# Patient Record
Sex: Female | Born: 1940 | Race: White | Hispanic: No | Marital: Married | Smoking: Never smoker
Health system: Southern US, Community
[De-identification: ages and names within clinical notes are randomized; demographics above are authoritative.]

## PROBLEM LIST (undated history)

## (undated) DIAGNOSIS — IMO0002 Reserved for concepts with insufficient information to code with codable children: Secondary | ICD-10-CM

## (undated) DIAGNOSIS — Z889 Allergy status to unspecified drugs, medicaments and biological substances status: Secondary | ICD-10-CM

## (undated) DIAGNOSIS — Z974 Presence of external hearing-aid: Secondary | ICD-10-CM

## (undated) DIAGNOSIS — M720 Palmar fascial fibromatosis [Dupuytren]: Secondary | ICD-10-CM

## (undated) DIAGNOSIS — Z8719 Personal history of other diseases of the digestive system: Secondary | ICD-10-CM

## (undated) DIAGNOSIS — E039 Hypothyroidism, unspecified: Secondary | ICD-10-CM

## (undated) DIAGNOSIS — Z973 Presence of spectacles and contact lenses: Secondary | ICD-10-CM

## (undated) DIAGNOSIS — M1990 Unspecified osteoarthritis, unspecified site: Secondary | ICD-10-CM

## (undated) DIAGNOSIS — I10 Essential (primary) hypertension: Secondary | ICD-10-CM

## (undated) DIAGNOSIS — M329 Systemic lupus erythematosus, unspecified: Secondary | ICD-10-CM

## (undated) DIAGNOSIS — K219 Gastro-esophageal reflux disease without esophagitis: Secondary | ICD-10-CM

## (undated) DIAGNOSIS — F419 Anxiety disorder, unspecified: Secondary | ICD-10-CM

## (undated) HISTORY — PX: DILATION AND CURETTAGE OF UTERUS: SHX78

## (undated) HISTORY — PX: TYMPANOSTOMY TUBE PLACEMENT: SHX32

## (undated) HISTORY — PX: MASTOIDECTOMY: SHX711

## (undated) HISTORY — PX: COLONOSCOPY: SHX174

## (undated) HISTORY — PX: APPENDECTOMY: SHX54

## (undated) HISTORY — PX: TONSILLECTOMY: SUR1361

## (undated) HISTORY — PX: TUBAL LIGATION: SHX77

---

## 1972-07-26 HISTORY — PX: TUBAL LIGATION: SHX77

## 1989-07-26 DIAGNOSIS — C801 Malignant (primary) neoplasm, unspecified: Secondary | ICD-10-CM

## 1989-07-26 HISTORY — DX: Malignant (primary) neoplasm, unspecified: C80.1

## 1989-07-26 HISTORY — PX: BREAST LUMPECTOMY: SHX2

## 1998-01-14 ENCOUNTER — Other Ambulatory Visit: Admission: RE | Admit: 1998-01-14 | Discharge: 1998-01-14 | Payer: Self-pay | Admitting: Otolaryngology

## 1998-02-27 ENCOUNTER — Other Ambulatory Visit: Admission: RE | Admit: 1998-02-27 | Discharge: 1998-02-27 | Payer: Self-pay | Admitting: Obstetrics & Gynecology

## 1999-05-13 ENCOUNTER — Encounter: Payer: Self-pay | Admitting: Emergency Medicine

## 1999-05-13 ENCOUNTER — Emergency Department (HOSPITAL_COMMUNITY): Admission: EM | Admit: 1999-05-13 | Discharge: 1999-05-13 | Payer: Self-pay | Admitting: Emergency Medicine

## 1999-08-26 ENCOUNTER — Ambulatory Visit (HOSPITAL_COMMUNITY): Admission: RE | Admit: 1999-08-26 | Discharge: 1999-08-26 | Payer: Self-pay | Admitting: Gastroenterology

## 1999-11-12 ENCOUNTER — Encounter: Admission: RE | Admit: 1999-11-12 | Discharge: 1999-11-12 | Payer: Self-pay | Admitting: Obstetrics & Gynecology

## 1999-11-12 ENCOUNTER — Encounter: Payer: Self-pay | Admitting: Obstetrics & Gynecology

## 2000-10-14 ENCOUNTER — Encounter: Payer: Self-pay | Admitting: Obstetrics & Gynecology

## 2000-10-14 ENCOUNTER — Encounter: Admission: RE | Admit: 2000-10-14 | Discharge: 2000-10-14 | Payer: Self-pay | Admitting: Obstetrics & Gynecology

## 2001-02-08 ENCOUNTER — Encounter (INDEPENDENT_AMBULATORY_CARE_PROVIDER_SITE_OTHER): Payer: Self-pay | Admitting: *Deleted

## 2001-02-08 ENCOUNTER — Other Ambulatory Visit: Admission: RE | Admit: 2001-02-08 | Discharge: 2001-02-08 | Payer: Self-pay | Admitting: Otolaryngology

## 2001-08-11 ENCOUNTER — Other Ambulatory Visit: Admission: RE | Admit: 2001-08-11 | Discharge: 2001-08-11 | Payer: Self-pay | Admitting: Obstetrics & Gynecology

## 2001-10-16 ENCOUNTER — Encounter: Admission: RE | Admit: 2001-10-16 | Discharge: 2001-10-16 | Payer: Self-pay | Admitting: Obstetrics & Gynecology

## 2001-10-16 ENCOUNTER — Encounter: Payer: Self-pay | Admitting: Obstetrics & Gynecology

## 2002-08-16 ENCOUNTER — Other Ambulatory Visit: Admission: RE | Admit: 2002-08-16 | Discharge: 2002-08-16 | Payer: Self-pay | Admitting: Obstetrics & Gynecology

## 2003-10-23 ENCOUNTER — Other Ambulatory Visit: Admission: RE | Admit: 2003-10-23 | Discharge: 2003-10-23 | Payer: Self-pay | Admitting: Obstetrics & Gynecology

## 2004-05-07 ENCOUNTER — Encounter: Admission: RE | Admit: 2004-05-07 | Discharge: 2004-05-07 | Payer: Self-pay | Admitting: Internal Medicine

## 2004-11-26 ENCOUNTER — Other Ambulatory Visit: Admission: RE | Admit: 2004-11-26 | Discharge: 2004-11-26 | Payer: Self-pay | Admitting: Obstetrics & Gynecology

## 2009-07-26 HISTORY — PX: DILATION AND CURETTAGE OF UTERUS: SHX78

## 2009-11-26 ENCOUNTER — Emergency Department (HOSPITAL_COMMUNITY): Admission: EM | Admit: 2009-11-26 | Discharge: 2009-11-26 | Payer: Self-pay | Admitting: Emergency Medicine

## 2010-10-13 LAB — CBC
HCT: 33.4 % — ABNORMAL LOW (ref 36.0–46.0)
MCHC: 33.2 g/dL (ref 30.0–36.0)
MCV: 93.3 fL (ref 78.0–100.0)
Platelets: 191 10*3/uL (ref 150–400)
RDW: 14.4 % (ref 11.5–15.5)
WBC: 10.8 10*3/uL — ABNORMAL HIGH (ref 4.0–10.5)

## 2010-10-13 LAB — BASIC METABOLIC PANEL
BUN: 22 mg/dL (ref 6–23)
CO2: 26 mEq/L (ref 19–32)
Chloride: 109 mEq/L (ref 96–112)
Creatinine, Ser: 1 mg/dL (ref 0.4–1.2)
Glucose, Bld: 105 mg/dL — ABNORMAL HIGH (ref 70–99)

## 2010-10-13 LAB — POCT CARDIAC MARKERS: Myoglobin, poc: 78.4 ng/mL (ref 12–200)

## 2010-10-13 LAB — DIFFERENTIAL
Basophils Relative: 0 % (ref 0–1)
Eosinophils Absolute: 0.1 10*3/uL (ref 0.0–0.7)
Eosinophils Relative: 1 % (ref 0–5)
Neutrophils Relative %: 81 % — ABNORMAL HIGH (ref 43–77)

## 2010-10-13 LAB — GLUCOSE, CAPILLARY

## 2010-12-11 NOTE — Procedures (Signed)
[place]. [facility]  Patient:    Amy Krause                          MRN: [id number] Proc. Date: 08/26/99 Adm. Date:  [id number] Attending:  Rich Brave CC:         Freddy Finner, M.D.                           Procedure Report  PROCEDURE:  Colonoscopy.  SURGEON:  Florencia Reasons, M.D.  INDICATIONS:  A 70 year old female with negative colonoscopy about 4 years ago, who recently had quite a bit of rectal bleeding, which has pretty much tapered off since that time.  FINDINGS:  Right side diverticulosis.  Minimal internal hemorrhoids.  PROCEDURE:  The nature, purpose, and risks of the procedure were familiar to the patient from prior examination.  She provided written consent.  Sedation was fentanyl 85 mcg and Versed 10 mg IV without arrhythmias or desaturation.  The Olympus adult video colonoscope was advanced quite easily to the region of he hepatic flexure, but then we encountered considerable looping, which was hard to overcome, despite having the patient in different positions.  Eventually, we got a short distance above the cecum and to turn the patient into the right lateral and ultimately the left lateral decubitus position to reach the base of the cecum.  Pullback was then performed.  The quality of the prep was excellent and it is felt that all areas were well seen.  There was a moderate amount of right sided diverticulosis, but no evidence of polyps, cancer, colitis, or vascular malformations.  Retroflexion in the rectum, as well as, pullout through the anal canal were really unremarkable, without overt  enlargement of internal hemorrhoids.  No biopsies were obtained.  The patient tolerated the procedure well and there were no apparent complications.  IMPRESSION:  Essentially normal colonoscopy.  Mild to moderate right side diverticulosis.  Small internal hemorrhoids due not look impressive at this time, but  presumably account for her previous bleeding.  PLAN:  Consider screening flexible sigmoidoscopy in 5 years. DD:  08/26/99 TD:  08/26/99 Job: [id number]

## 2012-03-14 ENCOUNTER — Other Ambulatory Visit: Payer: Self-pay | Admitting: Obstetrics & Gynecology

## 2012-03-14 DIAGNOSIS — Z1231 Encounter for screening mammogram for malignant neoplasm of breast: Secondary | ICD-10-CM

## 2012-03-21 ENCOUNTER — Other Ambulatory Visit: Payer: Self-pay | Admitting: Obstetrics & Gynecology

## 2012-03-21 DIAGNOSIS — M8580 Other specified disorders of bone density and structure, unspecified site: Secondary | ICD-10-CM

## 2012-04-03 ENCOUNTER — Ambulatory Visit
Admission: RE | Admit: 2012-04-03 | Discharge: 2012-04-03 | Disposition: A | Discharge status: Home / Self Care | Payer: Medicare Other | Source: Ambulatory Visit | Attending: Obstetrics & Gynecology | Admitting: Obstetrics & Gynecology

## 2012-04-03 DIAGNOSIS — M8580 Other specified disorders of bone density and structure, unspecified site: Secondary | ICD-10-CM

## 2012-04-03 DIAGNOSIS — Z1231 Encounter for screening mammogram for malignant neoplasm of breast: Secondary | ICD-10-CM

## 2012-04-05 ENCOUNTER — Ambulatory Visit: Payer: Medicare Other

## 2012-04-20 ENCOUNTER — Other Ambulatory Visit: Payer: Self-pay | Admitting: Obstetrics & Gynecology

## 2012-04-20 DIAGNOSIS — R928 Other abnormal and inconclusive findings on diagnostic imaging of breast: Secondary | ICD-10-CM

## 2012-04-21 ENCOUNTER — Ambulatory Visit
Admission: RE | Admit: 2012-04-21 | Discharge: 2012-04-21 | Disposition: A | Discharge status: Home / Self Care | Payer: Medicare Other | Source: Ambulatory Visit | Attending: Obstetrics & Gynecology | Admitting: Obstetrics & Gynecology

## 2012-04-21 DIAGNOSIS — R928 Other abnormal and inconclusive findings on diagnostic imaging of breast: Secondary | ICD-10-CM

## 2013-02-16 ENCOUNTER — Other Ambulatory Visit: Payer: Self-pay | Admitting: Otolaryngology

## 2013-02-16 ENCOUNTER — Other Ambulatory Visit: Payer: Self-pay

## 2013-02-16 DIAGNOSIS — Z1231 Encounter for screening mammogram for malignant neoplasm of breast: Secondary | ICD-10-CM

## 2013-02-16 DIAGNOSIS — H903 Sensorineural hearing loss, bilateral: Secondary | ICD-10-CM

## 2013-02-20 ENCOUNTER — Ambulatory Visit
Admission: RE | Admit: 2013-02-20 | Discharge: 2013-02-20 | Disposition: A | Discharge status: Home / Self Care | Payer: Medicare Other | Source: Ambulatory Visit | Attending: Otolaryngology | Admitting: Otolaryngology

## 2013-02-20 DIAGNOSIS — H903 Sensorineural hearing loss, bilateral: Secondary | ICD-10-CM

## 2013-04-04 ENCOUNTER — Ambulatory Visit
Admission: RE | Admit: 2013-04-04 | Discharge: 2013-04-04 | Disposition: A | Discharge status: Home / Self Care | Payer: Medicare Other | Source: Ambulatory Visit

## 2013-04-04 DIAGNOSIS — Z1231 Encounter for screening mammogram for malignant neoplasm of breast: Secondary | ICD-10-CM

## 2013-05-01 ENCOUNTER — Ambulatory Visit
Admission: RE | Admit: 2013-05-01 | Discharge: 2013-05-01 | Disposition: A | Discharge status: Home / Self Care | Payer: Medicare Other | Source: Ambulatory Visit | Attending: Internal Medicine | Admitting: Internal Medicine

## 2013-05-01 ENCOUNTER — Other Ambulatory Visit: Payer: Self-pay | Admitting: Internal Medicine

## 2013-05-01 DIAGNOSIS — R05 Cough: Secondary | ICD-10-CM

## 2013-05-02 ENCOUNTER — Encounter (HOSPITAL_BASED_OUTPATIENT_CLINIC_OR_DEPARTMENT_OTHER): Payer: Self-pay | Admitting: *Deleted

## 2013-05-02 NOTE — Progress Notes (Signed)
Pt to stay overnight-bring all meds and overnight bag

## 2013-05-02 NOTE — Progress Notes (Signed)
Did not need labs, but dr Valentina Lucks did ekg,cxr,labs yesterday 05/01/13-called for results

## 2013-05-04 NOTE — H&P (Signed)
Amy Krause is an 72 y.o. female.   Chief Complaint: Chronic drainage, right ear HPI: See H&P below  History & Physical Examination   Patient: Amy Krause  Provider: Ermalinda Barrios, MD, MS, FACS  Date of Service:  May 02, 2013  Location: [facility] [place], [place].                  [address]                  [place], [place]   [id number]                                Ph: [phone], Fax: [phone]                  [email or web address]     Provider: Ermalinda Barrios, MD, MS, FACS Encounter Date: May 02, 2013  Patient: Amy Krause, Amy Krause) Sex: Female       DOB: Amy Krause, Amy Krause      Age: 62 year Krause month       Race: White Address: [address],  [place]  [place]  [address] Primary [address].: Jonny Ruiz GRIFFIN Insurance: MEDICARE ADVANTAGE  Referred By:  Ermalinda Barrios   Visit Type: Amy Krause, a 54 year 44 month White female is here today for a pre-operative visit.  Complaint/HPI: The patient was here today for a preoperative evaluation prior to undergoing a right canal up to the mastoidectomy for treatment of chronic suppurative otitis media and mastoiditis. The patient has seen Dr. Valentina Lucks. He started her on oral antibiotics and a burst of steroids. She denies fever, chills, or night sweats. She has been having some purulent, productive cough in the morning.  Previous history: The patient was seen as an emergency workin due to complaints of draining again from her right ear. She restarted Tobradex drops this morning. She denies fever, otalgia, vertigo or tinnitus. She related that she was tired of the intermittent drainage and multiple drops and oral antibiotics. She was awake and alert today.  Previous Hx: The patient was here today for follow-up after almost finishing six weeks of doxycycline and Tobradex drops. Patient denies any otorrhea and has not had recent otalgia. She feels that her right ear is full. She has no other symptoms. Patient has  found it very difficult to time the taking of her different medications.  Previous history: The patient is here today after taking antibiotics for three weeks, and using topical Tobradex drops AU for three weeks. 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 She is feeling somewhat better and is unable to tell if she is draining from her right ear as she is using drops BID. She has had to obtain a vaginal suppository for a yeast infection.  Previous history: Patient was here today in follow-up after continuing to use Tobradex ophthalmic drops in the left ear. The patient continues to complain of drainage from both ears, and a bass sound in her hearing. Her right ear is bothering her more than her left ear. She is on allergy desensitization immunizations by Dr. Concord Callas. She does not have any allergy symptoms. There have been no environmental changes at her house. She has failed several rounds of steroids and antibiotics as well as topical otic drops. Her recent cultures have been negative. She originally grew coag neg  staph. The patient is frustrated by not being able to hear well.  Previous history: Patient reports draining from her ears again after using Tobradex drops. Has had some tinnitus. She has had more drainage from the right than the left. She complains of being frustrated with the continued drainage and not being able to tell how many drops she is placing in her ear. She has lupus skin lesions.  Previous Hx: The patient's her today after being treated with multiple medications. She is feeling much better and denies otorrhea or otalgia. She looks much better.  Previous history: The patient was here today complaining of feeling very badly and draining from both ears. She can understand why she is draining after she has had tubes placed. She denied any upper respiratory tract infection, cough, fever, tinnitus or vertigo. She has not been swimming.  Previous  history: The patient was here today for follow-up after undergoing BMT's in the operating room. She thinks that her hearing has decreased in the left ear and complains of some clear drainage when she sleeps on her left ear. She has no other symptoms.  Previous history: The patient is here complaining of decreased hearing in the left ear. Her Paparella type I tubes have been in place for many years. She denies otorrhea, tinnitus or vertigo. She has been experiencing some minor otalgia. In the left ear.   Previous history: The patient was here today for a routine ear and BMT check. She denies otorrhea, change in hearing, or otalgia. She is successfully wearing hearing aids. She does not report any change in hearing.  Previous Hx: The patient is here today to have her ears evaluated. She has had a Paparella type II tube in her right tympanic membrane for 23 years. She is had a Paparella type one tube in her left tympanic membrane since 2005. She is doing well. She has no complaints of otorrhea or otalgia.  Previous history: Patient is here today to have her tubes checked. She does not have any complaints. She denies otorrhea or otalgia. She is wearing a hearing aid in the right ear.  Previous history: The patient is here for a tube check AU. She complains only of some itching of her left ear. She denies any otorrhea, otalgia, tinnitus or vertigo.   Current Medication: 1. Doxycycline Mono 100 Mg Cap  SIG: Take 1 twice daily x 10 days 2. Doxycycline Mono 100 Mg Cap  SIG: take 1 tab PO BID 3. Clobetasol 0.05% Ointment (Other MD)  4. Evista 60 Mg Tablet (Other MD)  5. Levothyroxine 75 Mcg Tablet (Other MD)  6. Calcium 500 Mg Chewable Tablet 500 Mg Calcium (1,250 Mg) (Other MD)  7. Levoxyl 75 Mcg Tablet (Other MD)  8. Multi Vitamin Daily Tablet (Other MD)   Medical History: Vaccinations: Flu vaccinations: Yes, had a flu shot since April 25, 2013 - code [id number].  Pneumonia vaccination: Yes - code  4040F.  Surgical History: Prior surgeries include lumpectomy and Mastoidectomy.  Cancer: (+) Cancer: Breast Cancer..  Family History: The patient has a family history of Diabetes mellitus.  Social History: Smoking: Her current smoking status is never smoker/non-smoker. Alcohol: Patient denies drinking alcoholic beverages. Marital Status: Patient is married. HIV status: (-) HIV status. Recreational Drug Use: She denies recreational drug use.  Allergy: Sulfa (Sulfonamide Antibiotics), Sulfasalazine  ROS: General: (-) fever, (-) chills, (-) night sweats, (-) fatigue, (-) weakness, (-) changes in appetite or weight. (+) seasonal allergies. Head: (-) headaches, (-) head  injury or deformity. Eyes: (-) visual changes, (-) eye pain, (-) eye discharges, (-) redness, (-) itching, (-) excessive tearing, (-) double or blurred vision, (-) glaucoma, (-) cataracts. Ears: (+) earache , stopped up feeling. Nose and Sinuses: (-) frequent colds, (-) nasal stuffiness or itchiness, (-) postnasal drip, (-) hay fever, (-) nosebleeds, (-) sinus trouble. Mouth and Throat: (-) bleeding gums, (-) toothache, (-) odd taste sensations, (-) sores on tongue, (-) frequent sore throat, (-) hoarseness. Neck: (-) swollen glands, (-) enlarged thyroid, (-) neck pain. Cardiac: (-) chest pain, (-) edema, (-) high blood pressure, (-) irregular heartbeat, (-) orthopnea, (-) palpitations, (-) paroxysmal nocturnal dyspnea, (-) shortness of breath. Respiratory: (-) cough, (-) hemoptysis, (-) shortness of breath, (-) cyanosis, (-) wheezing, (-) nocturnal choking or gasping, (-) TB exposure. Breasts: cancer. Gastrointestinal: (-) abdominal pain, (-) heartburn, (-) constipation, (-) diarrhea, (-) nausea, (-) vomiting, (-) hematochezia, (-) melena, (-) change in bowel habits. Urinary: (-) dysuria, (-) frequency, (-) urgency, (-) hesitancy, (-) polyuria, (-) nocturia, (-) hematuria, (-) urinary incontinence, (-) flank pain, (-)  change in urinary habits. Gynecologic/Urologic: (-) genital sores or lesions, (-) history of STD, (-) sexual difficulties. Musculoskeletal: (+) arthritis. Peripheral Vascular: (-) intermittent claudication, (-) cramps, (-) varicose veins, (-) thrombophlebitis. Neurological: (-) numbness, (-) tingling, (-) tremors, (-) seizures, (-) vertigo, (-) dizziness, (-) memory loss, (-) any focal or diffuse neurological deficits. Psychiatric: (-) anxiety, (-) depression, (-) sleep disturbance, (-) irritability, (-) mood swings, (-) suicidal thoughts or ideations. Endocrine: (+) thyroid problems. Hematologic/Lymphatic: (-) anemia, (-) easy bruising, (-) excessive bleeding, (-) history of blood transfusions. Skin: (-) rashes, (-) lumps, (-) itching, (-) dryness, (-) acne, (-) discoloration, (-) recurrent skin infections, (-) changes in hair, nails or moles.  Vital Signs: Weight:   76.204 kgs Height:   4\' 11"  BMI:   33.93 BSA:   1.78 BP:   125/80  Examination: General Appearance - Adult: The patient is a well-developed, well-nourished, female, has no recognizable syndromes or patterns of malformation, and is in no acute distress. She is awake, alert, coherent, spontaneous, and logical. She is oriented to time, place, and person and communicates without difficulty.  Head: The patient's head was normocephalic and without any evidence of trauma or lesions.  Face: Her facial motion was intact and symmetric bilaterally with normal resting facial tone and voluntary facial power.  Skin: Gross inspection of her facial skin demonstrated no evidence of abnormality.  Eyes: Her pupils are equal, regular, reactive to light and accomodate (PERRLA). Extraocular movements were intact (EOMI). Conjunctivae were normal. There was no sclera icterus. There was no nystagmus. Eyelids appeared normal. There was no ptosis, lidlag, lid edema, or lagophthalmus.  External ears: Both of her external ears were normal in size,  shape, angulation, and location.  External auditory canals: Examination of the external auditory canals revealed Procedure: The microscope and suction were used to clean the left ear canal. There small amount of thick mucoid drainage around the right tube. The tube was patent. There was watery drainage around the left tube. The tube was patent after the drainage was suction evacuated.  Right Tympanic Membrane: The right tube is in position with some moisture around the circumference of the tube. The moisture was not purulent.  Left Tympanic Membrane: The left tube was in place. There were some slight moisture covering the anterior one half of the tympanic membrane. It was not purulent.  Nose - external exam: External examination of the nose revealed a stable nasal  dorsum with normal support, normal skin, and patent nares. There were no deformities. Nose - internal exam: Anterior rhinoscopy revealed healthy, pink nasal septal and inferior/middle turbinate mucosa. The nasal septum was midline and without lesions or perforations. There was no bleeding noted. There were no polyps, lesions, masses or foreign bodies. Her airway was patent bilaterally.  Oral Cavity: Examination of the oral cavity revealed healthy moist mucosa, no evidence of lesions, ulcerations, erythema, edema, or leukoplakia. Gingiva and teeth were unremarkable. Her lips, tongue and palates were normal. There were no lingual fasciculations. The oropharynx was symmetric and without lesions. The gag reflex was intact and symmetric.  Neck: Examination of her neck revealed full range of motion without pain. There were no significant palpable masses or cervical lymphadenopathy. There was normal laryngeal crepitus. The trachea was midline. Her thyroid gland was not enlarged and did not have any palpable masses. There was no evidence of jugular venous distention. There were no audible carotid bruits.  Audiology Procedures: Audiogram/Graph  Audiogram: I have referred the patient for audiometric testing. I have reviewed the patient's audiogram. (EMK). The patient was found to have moderate to moderately severe bilateral asymmetric sensorineural hearing losses, right ear greater than left ear with SRTs of 55 dB right ear, 45 dB left ear. She had 80% discrimination right ear and 100% discrimination left ear.  Impression: Other:  1. The patient has completed six weeks of oral doxycycline and Tobradex drops AU and began to drain again from her right ear. Recommended that we now proceed with a R canal up tympanomastoidectomy. She has been evaluated by Dr. Kirby Funk, and has been cleared for general anesthesia. Schedule for 4 hours, general anesthesia, [facility], 23 hr RCC stay. Risks, complication, and alternatives were explained to her questions were invited and answered. Informed consent was signed and witnessed. Preop teaching and counseling were provided. The patient is very familiar with mastoidectomy procedures.  2. 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 3. Chronic bilateral eustachian tube dysfunction 4. She Latora wear her hearing aids. 5. Lupus skin lesions. 6. Recent productive cough, on antibiotics and oral steroids.  Plan: Clinical summary letter made available to patient today. This letter Kismet not be complete at time of service. Please contact our office within 3 days for a completed summary of today's visit.  Status: Infected ear(s): both ears. Medications: continue the current drug regimen as prescribed, Finish oral antibiotic, Tobradex ophthal drops 3 gtts both ears  TID x 1 week. and Discontinue Tobradex and begin vancomycin drops 3 drops TID x 1 wk. Diet: no restriction. Procedure: Tympanomastoidectomy: Right ear. Duration:  4 hours. Surgeon: Carolan Shiver MD Office Phone: [phone] Office Fax: [phone] Cell Phone: [phone]. Anesthesia Required:  General. Type of Tube: T-tube. [facility]: yes. Latex Allergy: no.  Informed consent: Informed consent was provided in a quiet examination room and was witnessed. Risks, complications, and alternatives of ear surgery (such as doing nothing or using a hearing aid) were explained to the and included, but were not limited to: infection, bleeding, reaction to anesthesia, delayed perforation of the tympanic membrane and need for future repair, facial paresis or paralysis, tinnitus, vertigo and/or disequilibrium, partial or total loss of hearing, transient or prolonged taste disturbance, need for additional procedures, failure and/extrusion of prostheses, complications related to the use of cements, hypertrophic scar formation, ear numbness, failure to preserve or improve hearing, other unforeseen or unpredictable complications, death, etc. Questions were invited and answered. Preoperative  teaching and counseling were provided. Informed consent - status: Informed consent was provided and was signed and witnessed. Patient Instructions: Water precautions were explained to the patient/parent/legal guardian. The patient is to protect their ears using cotton with vaseline plugs, wax/silicon ear plugs, fitted ear plugs, or custom ear plugs. Follow-Up: Postoperative visit as scheduled.  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 Wears glasses   . Wears hearing aid     right ear  . Multiple allergies   . Lupus     Past Surgical History  Procedure Laterality Date  . Tonsillectomy    . Appendectomy    . Tubal ligation    .  Dilation and curettage of uterus    . Colonoscopy    . Mastoidectomy  [phone]    right    History reviewed. No pertinent family history. Social History:  reports that she has never smoked. She does not have any smokeless tobacco history on file. She reports that she does not drink alcohol. Her drug history is not on file.  Allergies:  Allergies  Allergen Reactions  . Sulfa Antibiotics     Mouth blisters  . Tape     Blisters skin    No prescriptions prior to admission    No results found for this or any previous visit (from the past 48 hour(s)). No results found.  Review of Systems  Constitutional: Negative.   HENT: Positive for ear discharge, ear pain, hearing loss and tinnitus.   Eyes: Negative.   Respiratory: Negative.   Cardiovascular: Negative.   Gastrointestinal: Negative.   Genitourinary: Negative.   Musculoskeletal: Negative.   Skin: Negative.   Neurological: Negative.   Endo/Heme/Allergies: Negative.   Psychiatric/Behavioral: Negative.     Height 4\' 11"  (1.499 m), weight 74.39 kg (164 lb). Physical Exam   Assessment/Plan 1. Chronic suppurative otitis media and mastoiditis, right ear, documented on CT scan of her temporal bones 2. Bilateral asymmetric high-frequency sensorineural hearing loss 3. Chronic bilateral eustachian tube dysfunction 4. Lupus skin lesions 5. Recent upper respiratory tract infection 6. Proceed with right canal up tympanomastoidectomy, four hours, general anesthesia, 23 hour observation. Risks, complications, and alternatives have been explained to the patient. Questions were invited and answered. Informed consent has been signed and witnessed. Preop teaching and counseling have been completed. 7. The procedure is scheduled for May 08, 2013.   Chia Rock M 05/04/2013, 12:12 PM

## 2013-05-08 ENCOUNTER — Encounter (HOSPITAL_BASED_OUTPATIENT_CLINIC_OR_DEPARTMENT_OTHER): Payer: Self-pay | Admitting: Anesthesiology

## 2013-05-08 ENCOUNTER — Encounter (HOSPITAL_BASED_OUTPATIENT_CLINIC_OR_DEPARTMENT_OTHER): Payer: Medicare Other | Admitting: Anesthesiology

## 2013-05-08 ENCOUNTER — Encounter (HOSPITAL_BASED_OUTPATIENT_CLINIC_OR_DEPARTMENT_OTHER)
Admission: RE | Disposition: A | Discharge status: Home / Self Care | Payer: Self-pay | Source: Ambulatory Visit | Attending: Otolaryngology

## 2013-05-08 ENCOUNTER — Ambulatory Visit (HOSPITAL_BASED_OUTPATIENT_CLINIC_OR_DEPARTMENT_OTHER): Payer: Medicare Other | Admitting: Anesthesiology

## 2013-05-08 ENCOUNTER — Ambulatory Visit (HOSPITAL_BASED_OUTPATIENT_CLINIC_OR_DEPARTMENT_OTHER)
Admission: RE | Admit: 2013-05-08 | Discharge: 2013-05-09 | Disposition: A | Discharge status: Home / Self Care | Payer: Medicare Other | Source: Ambulatory Visit | Attending: Otolaryngology | Admitting: Otolaryngology

## 2013-05-08 DIAGNOSIS — H663X9 Other chronic suppurative otitis media, unspecified ear: Secondary | ICD-10-CM | POA: Insufficient documentation

## 2013-05-08 DIAGNOSIS — H7010 Chronic mastoiditis, unspecified ear: Secondary | ICD-10-CM | POA: Insufficient documentation

## 2013-05-08 HISTORY — DX: Presence of spectacles and contact lenses: Z97.3

## 2013-05-08 HISTORY — PX: TYMPANOMASTOIDECTOMY: SHX34

## 2013-05-08 HISTORY — DX: Allergy status to unspecified drugs, medicaments and biological substances: Z88.9

## 2013-05-08 HISTORY — DX: Presence of external hearing-aid: Z97.4

## 2013-05-08 HISTORY — DX: Systemic lupus erythematosus, unspecified: M32.9

## 2013-05-08 HISTORY — DX: Reserved for concepts with insufficient information to code with codable children: IMO0002

## 2013-05-08 LAB — POCT HEMOGLOBIN-HEMACUE: Hemoglobin: 12.8 g/dL (ref 12.0–15.0)

## 2013-05-08 SURGERY — TYMPANOPLASTY, WITH MASTOIDECTOMY
Anesthesia: General | Site: Ear | Laterality: Right | Wound class: Clean Contaminated

## 2013-05-08 MED ORDER — FENTANYL CITRATE 0.05 MG/ML IJ SOLN
INTRAMUSCULAR | Status: DC | PRN
  Administered 2013-05-08: 100 ug via INTRAVENOUS
  Administered 2013-05-08 (×2): 50 ug via INTRAVENOUS
  Administered 2013-05-08 (×2): 100 ug via INTRAVENOUS

## 2013-05-08 MED ORDER — ONDANSETRON HCL 4 MG/2ML IJ SOLN
4.0000 mg | Freq: Four times a day (QID) | INTRAMUSCULAR | Status: DC | PRN
  Administered 2013-05-08: 4 mg via INTRAVENOUS

## 2013-05-08 MED ORDER — EPINEPHRINE HCL 1 MG/ML IJ SOLN
INTRAMUSCULAR | Status: DC | PRN
  Administered 2013-05-08: 1 mg

## 2013-05-08 MED ORDER — ZOLPIDEM TARTRATE 5 MG PO TABS: 5.0000 mg | ORAL_TABLET | Freq: Every evening | ORAL | Status: DC | PRN

## 2013-05-08 MED ORDER — DEXAMETHASONE SODIUM PHOSPHATE 4 MG/ML IJ SOLN: 10.0000 mg | Freq: Once | INTRAMUSCULAR | Status: DC

## 2013-05-08 MED ORDER — FENTANYL CITRATE 0.05 MG/ML IJ SOLN: 50.0000 ug | INTRAMUSCULAR | Status: DC | PRN

## 2013-05-08 MED ORDER — PHENYLEPHRINE HCL 10 MG/ML IJ SOLN
INTRAMUSCULAR | Status: DC | PRN
  Administered 2013-05-08 (×2): 80 ug via INTRAVENOUS

## 2013-05-08 MED ORDER — CIPROFLOXACIN-HYDROCORTISONE 0.2-1 % OT SUSP
OTIC | Status: AC
  Filled 2013-05-08: qty 10

## 2013-05-08 MED ORDER — PROPOFOL 10 MG/ML IV BOLUS
INTRAVENOUS | Status: DC | PRN
  Administered 2013-05-08: 150 mg via INTRAVENOUS
  Administered 2013-05-08: 50 mg via INTRAVENOUS

## 2013-05-08 MED ORDER — METHYLENE BLUE 1 % INJ SOLN
INTRAMUSCULAR | Status: DC | PRN
  Administered 2013-05-08: 1 mL via INTRADERMAL

## 2013-05-08 MED ORDER — LIDOCAINE HCL 2 % IJ SOLN
INTRAMUSCULAR | Status: AC
  Filled 2013-05-08: qty 20

## 2013-05-08 MED ORDER — DEXTROSE IN LACTATED RINGERS 5 % IV SOLN
INTRAVENOUS | Status: DC
  Administered 2013-05-08: 23:00:00 via INTRAVENOUS
  Administered 2013-05-08: 125 mL/h via INTRAVENOUS

## 2013-05-08 MED ORDER — LIDOCAINE-EPINEPHRINE 1 %-1:100000 IJ SOLN
INTRAMUSCULAR | Status: DC | PRN
  Administered 2013-05-08: 4 mL via INTRADERMAL

## 2013-05-08 MED ORDER — ONDANSETRON HCL 4 MG/2ML IJ SOLN: 4.0000 mg | Freq: Once | INTRAMUSCULAR | Status: DC

## 2013-05-08 MED ORDER — CEFAZOLIN SODIUM-DEXTROSE 2-3 GM-% IV SOLR
INTRAVENOUS | Status: AC
  Filled 2013-05-08: qty 50

## 2013-05-08 MED ORDER — MEPERIDINE HCL 25 MG/ML IJ SOLN: 6.2500 mg | INTRAMUSCULAR | Status: DC | PRN

## 2013-05-08 MED ORDER — DEXAMETHASONE SODIUM PHOSPHATE 4 MG/ML IJ SOLN
4.0000 mg | Freq: Once | INTRAMUSCULAR | Status: AC
  Administered 2013-05-09: 4 mg via INTRAVENOUS

## 2013-05-08 MED ORDER — ONDANSETRON HCL 4 MG/2ML IJ SOLN
INTRAMUSCULAR | Status: DC | PRN
  Administered 2013-05-08: 4 mg via INTRAMUSCULAR

## 2013-05-08 MED ORDER — CEFAZOLIN SODIUM 1-5 GM-% IV SOLN
INTRAVENOUS | Status: AC
  Filled 2013-05-08: qty 50

## 2013-05-08 MED ORDER — ONDANSETRON HCL 4 MG/2ML IJ SOLN: 4.0000 mg | Freq: Once | INTRAMUSCULAR | Status: AC | PRN

## 2013-05-08 MED ORDER — MORPHINE BOLUS VIA INFUSION: 2.0000 mg | INTRAVENOUS | Status: DC | PRN

## 2013-05-08 MED ORDER — DEXAMETHASONE SODIUM PHOSPHATE 4 MG/ML IJ SOLN
6.0000 mg | Freq: Once | INTRAMUSCULAR | Status: AC
  Administered 2013-05-08: 6 mg via INTRAVENOUS

## 2013-05-08 MED ORDER — BACITRACIN ZINC 500 UNIT/GM EX OINT
TOPICAL_OINTMENT | CUTANEOUS | Status: DC | PRN
  Administered 2013-05-08 (×2): 1 via TOPICAL

## 2013-05-08 MED ORDER — ONDANSETRON HCL 4 MG/2ML IJ SOLN
INTRAMUSCULAR | Status: AC
  Filled 2013-05-08: qty 2

## 2013-05-08 MED ORDER — CEFAZOLIN SODIUM 1-5 GM-% IV SOLN
1.0000 g | Freq: Three times a day (TID) | INTRAVENOUS | Status: AC
  Administered 2013-05-08 – 2013-05-09 (×3): 1 g via INTRAVENOUS

## 2013-05-08 MED ORDER — HYDROMORPHONE HCL PF 1 MG/ML IJ SOLN
INTRAMUSCULAR | Status: AC
  Filled 2013-05-08: qty 1

## 2013-05-08 MED ORDER — DEXAMETHASONE SODIUM PHOSPHATE 10 MG/ML IJ SOLN
INTRAMUSCULAR | Status: AC
  Filled 2013-05-08: qty 1

## 2013-05-08 MED ORDER — METHYLPREDNISOLONE ACETATE 80 MG/ML IJ SUSP
INTRAMUSCULAR | Status: AC
  Filled 2013-05-08: qty 1

## 2013-05-08 MED ORDER — HYDROMORPHONE HCL PF 1 MG/ML IJ SOLN
0.2500 mg | INTRAMUSCULAR | Status: DC | PRN
  Administered 2013-05-08: 0.5 mg via INTRAVENOUS

## 2013-05-08 MED ORDER — DEXAMETHASONE SODIUM PHOSPHATE 4 MG/ML IJ SOLN
INTRAMUSCULAR | Status: DC | PRN
  Administered 2013-05-08: 10 mg via INTRAVENOUS

## 2013-05-08 MED ORDER — ACETAMINOPHEN 160 MG/5ML PO SOLN: 650.0000 mg | Freq: Four times a day (QID) | ORAL | Status: DC | PRN

## 2013-05-08 MED ORDER — CEFAZOLIN (ANCEF) 1 G IV SOLR
2.0000 g | INTRAVENOUS | Status: AC
  Administered 2013-05-08: 2 g

## 2013-05-08 MED ORDER — GLYCERIN (LAXATIVE) 2.1 G RE SUPP: 1.0000 | Freq: Every day | RECTAL | Status: DC | PRN

## 2013-05-08 MED ORDER — DIAZEPAM 5 MG PO TABS: 5.0000 mg | ORAL_TABLET | Freq: Three times a day (TID) | ORAL | Status: DC | PRN

## 2013-05-08 MED ORDER — CIPROFLOXACIN-DEXAMETHASONE 0.3-0.1 % OT SUSP
OTIC | Status: AC
  Filled 2013-05-08: qty 7.5

## 2013-05-08 MED ORDER — LIDOCAINE HCL (CARDIAC) 20 MG/ML IV SOLN
INTRAVENOUS | Status: DC | PRN
  Administered 2013-05-08: 100 mg via INTRAVENOUS

## 2013-05-08 MED ORDER — TOBRAMYCIN-DEXAMETHASONE 0.3-0.1 % OP SUSP
OPHTHALMIC | Status: AC
  Filled 2013-05-08: qty 2.5

## 2013-05-08 MED ORDER — LACTATED RINGERS IV SOLN
INTRAVENOUS | Status: DC
  Administered 2013-05-08 (×2): via INTRAVENOUS

## 2013-05-08 MED ORDER — MULTI-VITAMIN/MINERALS PO TABS: 1.0000 | ORAL_TABLET | Freq: Every day | ORAL | Status: DC

## 2013-05-08 MED ORDER — GUAIFENESIN ER 600 MG PO TB12: 1200.0000 mg | ORAL_TABLET | Freq: Two times a day (BID) | ORAL | Status: DC

## 2013-05-08 MED ORDER — RALOXIFENE HCL 60 MG PO TABS
60.0000 mg | ORAL_TABLET | Freq: Every evening | ORAL | Status: DC
Start: 2013-05-08 — End: 2013-05-09
  Administered 2013-05-08: 60 mg via ORAL

## 2013-05-08 MED ORDER — LIDOCAINE-EPINEPHRINE 1 %-1:100000 IJ SOLN
INTRAMUSCULAR | Status: AC
  Filled 2013-05-08: qty 1

## 2013-05-08 MED ORDER — SODIUM CHLORIDE 0.9 % IR SOLN
Status: DC | PRN
  Administered 2013-05-08: 500 mL

## 2013-05-08 MED ORDER — SODIUM CHLORIDE 0.9 % IR SOLN
Status: DC | PRN
  Administered 2013-05-08: 10:00:00

## 2013-05-08 MED ORDER — DEXAMETHASONE SODIUM PHOSPHATE 4 MG/ML IJ SOLN
8.0000 mg | Freq: Once | INTRAMUSCULAR | Status: AC
  Administered 2013-05-08: 8 mg via INTRAVENOUS

## 2013-05-08 MED ORDER — MIDAZOLAM HCL 5 MG/5ML IJ SOLN
INTRAMUSCULAR | Status: DC | PRN
  Administered 2013-05-08 (×2): 1 mg via INTRAVENOUS

## 2013-05-08 MED ORDER — METHYLENE BLUE 1 % INJ SOLN
INTRAMUSCULAR | Status: AC
  Filled 2013-05-08: qty 10

## 2013-05-08 MED ORDER — OXYCODONE HCL 5 MG PO TABS: 5.0000 mg | ORAL_TABLET | Freq: Once | ORAL | Status: AC | PRN

## 2013-05-08 MED ORDER — EPINEPHRINE HCL 1 MG/ML IJ SOLN
INTRAMUSCULAR | Status: AC
  Filled 2013-05-08: qty 1

## 2013-05-08 MED ORDER — MIDAZOLAM HCL 2 MG/2ML IJ SOLN: 1.0000 mg | INTRAMUSCULAR | Status: DC | PRN

## 2013-05-08 MED ORDER — LEVOTHYROXINE SODIUM 75 MCG PO TABS
75.0000 ug | ORAL_TABLET | Freq: Every day | ORAL | Status: DC
  Administered 2013-05-09: 75 ug via ORAL

## 2013-05-08 MED ORDER — EPHEDRINE SULFATE 50 MG/ML IJ SOLN
INTRAMUSCULAR | Status: DC | PRN
  Administered 2013-05-08 (×2): 10 mg via INTRAVENOUS

## 2013-05-08 MED ORDER — OXYCODONE HCL 5 MG/5ML PO SOLN: 5.0000 mg | Freq: Once | ORAL | Status: AC | PRN

## 2013-05-08 MED ORDER — GLYCOPYRROLATE 0.2 MG/ML IJ SOLN
INTRAMUSCULAR | Status: DC | PRN
  Administered 2013-05-08: 0.2 mg via INTRAVENOUS

## 2013-05-08 MED ORDER — SUCCINYLCHOLINE CHLORIDE 20 MG/ML IJ SOLN
INTRAMUSCULAR | Status: DC | PRN
  Administered 2013-05-08: 100 mg via INTRAVENOUS

## 2013-05-08 MED ORDER — OXYCODONE-ACETAMINOPHEN 5-325 MG PO TABS
1.0000 | ORAL_TABLET | ORAL | Status: DC | PRN
  Administered 2013-05-08 – 2013-05-09 (×5): 1 via ORAL

## 2013-05-08 MED ORDER — CALCIUM CARBONATE-VITAMIN D 500-200 MG-UNIT PO TABS: 1.0000 | ORAL_TABLET | Freq: Every day | ORAL | Status: DC

## 2013-05-08 MED ORDER — OXYCODONE-ACETAMINOPHEN 5-325 MG PO TABS
ORAL_TABLET | ORAL | Status: AC
  Filled 2013-05-08: qty 1

## 2013-05-08 MED ORDER — LIDOCAINE-EPINEPHRINE 0.5 %-1:200000 IJ SOLN
INTRAMUSCULAR | Status: AC
  Filled 2013-05-08: qty 1

## 2013-05-08 SURGICAL SUPPLY — 82 items
ATTRACTOMAT 16X20 MAGNETIC DRP (DRAPES) IMPLANT
BAG DECANTER FOR FLEXI CONT (MISCELLANEOUS) ×2 IMPLANT
BAG URINE DRAINAGE (UROLOGICAL SUPPLIES) IMPLANT
BANDAGE COBAN STERILE 2 (GAUZE/BANDAGES/DRESSINGS) IMPLANT
BIT DRILL LEGEND 0.5MM 70MM (BIT) IMPLANT
BIT DRILL LEGEND 1.0MM 70MM (BIT) IMPLANT
BIT DRILL LEGEND 4.0MM 70MM (BIT) IMPLANT
BLADE NEEDLE 3 SS STRL (BLADE) ×2 IMPLANT
BLADE SURG ROTATE 9660 (MISCELLANEOUS) ×2 IMPLANT
CANISTER SUCT 1200ML W/VALVE (MISCELLANEOUS) ×2 IMPLANT
CLOTH BEACON ORANGE TIMEOUT ST (SAFETY) ×2 IMPLANT
CORDS BIPOLAR (ELECTRODE) IMPLANT
COTTONBALL LRG STERILE PKG (GAUZE/BANDAGES/DRESSINGS) ×2 IMPLANT
DECANTER SPIKE VIAL GLASS SM (MISCELLANEOUS) ×2 IMPLANT
DEPRESSOR TONGUE BLADE STERILE (MISCELLANEOUS) ×4 IMPLANT
DRAIN PENROSE 1/2X12 LTX STRL (WOUND CARE) IMPLANT
DRAIN PENROSE 1/4X12 LTX STRL (WOUND CARE) IMPLANT
DRAPE INCISE 23X17 IOBAN STRL (DRAPES) ×1
DRAPE INCISE IOBAN 23X17 STRL (DRAPES) ×1 IMPLANT
DRAPE MICROSCOPE WILD 40.5X102 (DRAPES) ×2 IMPLANT
DRAPE SURG 17X23 STRL (DRAPES) ×2 IMPLANT
DRILL BIT LEGEND (BIT) ×2 IMPLANT
DRILL BIT LEGEND 7BA20-MN (BIT) IMPLANT
DRILL BIT LEGEND 7BA25-MN (BIT) IMPLANT
DRILL BIT LEGEND 7BA30-MN (BIT) IMPLANT
DRILL BIT LEGEND 7BA30D-MN (BIT) IMPLANT
DRILL BIT LEGEND 7BA30DL-MN (BIT) ×2 IMPLANT
DRILL BIT LEGEND 7BA30L-MN (BIT) ×2 IMPLANT
DRILL BIT LEGEND 7BA40-MN (BIT) IMPLANT
DRILL BIT LEGEND 7BA40D-MN (BIT) IMPLANT
DRILL BIT LEGEND 7BA50-MN (BIT) ×2 IMPLANT
DRILL BIT LEGEND 7BA50D-MN (BIT) ×2 IMPLANT
DRILL BIT LEGEND 7BA60-MN (BIT) IMPLANT
DRILL BIT LEGEND 7BA70-MN (BIT) ×2 IMPLANT
DRSG GLASSCOCK MASTOID ADT (GAUZE/BANDAGES/DRESSINGS) IMPLANT
DRSG GLASSCOCK MASTOID PED (GAUZE/BANDAGES/DRESSINGS) IMPLANT
ELECT COATED BLADE 2.86 ST (ELECTRODE) ×2 IMPLANT
ELECT PAIRED SUBDERMAL (MISCELLANEOUS) ×2
ELECT REM PT RETURN 9FT ADLT (ELECTROSURGICAL) ×2
ELECTRODE PAIRED SUBDERMAL (MISCELLANEOUS) ×1 IMPLANT
ELECTRODE REM PT RTRN 9FT ADLT (ELECTROSURGICAL) ×1 IMPLANT
GAUZE PACKING IODOFORM 1/4X5 (PACKING) ×2 IMPLANT
GAUZE SPONGE 4X4 12PLY STRL LF (GAUZE/BANDAGES/DRESSINGS) IMPLANT
GLOVE BIOGEL PI IND STRL 7.0 (GLOVE) ×2 IMPLANT
GLOVE BIOGEL PI INDICATOR 7.0 (GLOVE) ×2
GLOVE ECLIPSE 7.5 STRL STRAW (GLOVE) ×4 IMPLANT
GLOVE SURG SS PI 7.0 STRL IVOR (GLOVE) ×2 IMPLANT
GOWN PREVENTION PLUS XLARGE (GOWN DISPOSABLE) ×4 IMPLANT
IV NS 500ML (IV SOLUTION) ×1
IV NS 500ML BAXH (IV SOLUTION) ×1 IMPLANT
IV NS IRRIG 3000ML ARTHROMATIC (IV SOLUTION) ×2 IMPLANT
NDL SAFETY ECLIPSE 18X1.5 (NEEDLE) ×1 IMPLANT
NEEDLE 27GAX1X1/2 (NEEDLE) ×4 IMPLANT
NEEDLE BLD COLL 22X1.25 MULT (NEEDLE) ×2 IMPLANT
NEEDLE HYPO 18GX1.5 SHARP (NEEDLE) ×1
NS IRRIG 1000ML POUR BTL (IV SOLUTION) ×2 IMPLANT
PACK BASIN DAY SURGERY FS (CUSTOM PROCEDURE TRAY) ×2 IMPLANT
PACK ENT DAY SURGERY (CUSTOM PROCEDURE TRAY) ×2 IMPLANT
PENCIL BUTTON HOLSTER BLD 10FT (ELECTRODE) ×2 IMPLANT
PROBE NERVBE PRASS .33 (MISCELLANEOUS) IMPLANT
SET EXT MALE ROTATING LL 32IN (MISCELLANEOUS) ×2 IMPLANT
SET IRRIG Y TYPE TUR BLADDER L (SET/KITS/TRAYS/PACK) ×2 IMPLANT
SLEEVE SCD COMPRESS KNEE MED (MISCELLANEOUS) ×2 IMPLANT
SPONGE GAUZE 4X4 12PLY (GAUZE/BANDAGES/DRESSINGS) IMPLANT
SPONGE SURGIFOAM ABS GEL 12-7 (HEMOSTASIS) ×2 IMPLANT
STRIP CLOSURE SKIN 1/2X4 (GAUZE/BANDAGES/DRESSINGS) IMPLANT
SUT BONE WAX W31G (SUTURE) ×2 IMPLANT
SUT CHROMIC 3 0 PS 2 (SUTURE) ×4 IMPLANT
SUT CHROMIC 4 0 P 3 18 (SUTURE) IMPLANT
SUT ETHILON 5 0 PS 2 18 (SUTURE) IMPLANT
SUT MNCRL AB 3-0 PS2 18 (SUTURE) IMPLANT
SUT NOVAFIL 5 0 BLK 18 IN P13 (SUTURE) ×2 IMPLANT
SUT VIC AB 3-0 FS2 27 (SUTURE) IMPLANT
SYR 3ML 18GX1 1/2 (SYRINGE) IMPLANT
SYR 5ML LL (SYRINGE) IMPLANT
SYR BULB 3OZ (MISCELLANEOUS) ×2 IMPLANT
SYR TB 1ML LL NO SAFETY (SYRINGE) ×4 IMPLANT
TOWEL OR 17X24 6PK STRL BLUE (TOWEL DISPOSABLE) ×4 IMPLANT
TRAY DSU PREP LF (CUSTOM PROCEDURE TRAY) ×2 IMPLANT
TRAY FOLEY CATH 14FR (SET/KITS/TRAYS/PACK) ×2 IMPLANT
TUBE EAR PAPARELLA TYPE 1 (OTOLOGIC RELATED) ×2 IMPLANT
TUBING IRRIGATION STER IRD100 (TUBING) ×2 IMPLANT

## 2013-05-08 NOTE — Progress Notes (Signed)
S: Patient is awake and alert, some pain, min. drainage from penrose, c/o some disequilibrium when trying to walk  O: VS stable, awake and alert, facial function intact, no nystagmus, dressing stable, IV ok, voided without pain  A: 1. Stable post op course     2. Drainage as expected  P: 1. All findings explained to patient, husband and son, Johna Sheriff     2. Continue observation, IVF tonight, IV antibiotics and steroids     3. Plan to DC R postauricular drain in AM and DC home with husband if stable overnight

## 2013-05-08 NOTE — Anesthesia Postprocedure Evaluation (Signed)
Anesthesia Post Note  Patient: Amy Krause  Procedure(s) Performed: Procedure(s) (LRB): RIGHT  CANAL-UP TYMPANOMASTOIDECTOMY; MICRODISSECTION USING THE OR MICROSCOPE; PLACEMENT OF PAPARELLA TUBE IN RIGHT EAR  (Right)  Anesthesia type: general  Patient location: PACU  Post pain: Pain level controlled  Post assessment: Patient's Cardiovascular Status Stable  Last Vitals:  Filed Vitals:   05/08/13 1337  BP: 116/60  Pulse: 61  Temp: 36.4 C  Resp: 16    Post vital signs: Reviewed and stable  Level of consciousness: sedated  Complications: No apparent anesthesia complications

## 2013-05-08 NOTE — Anesthesia Procedure Notes (Signed)
Procedure Name: Intubation Date/Time: 05/08/2013 8:48 AM Performed by: Burna Cash Pre-anesthesia Checklist: Patient identified, Emergency Drugs available, Suction available and Patient being monitored Patient Re-evaluated:Patient Re-evaluated prior to inductionOxygen Delivery Method: Circle System Utilized Preoxygenation: Pre-oxygenation with 100% oxygen Intubation Type: IV induction Ventilation: Mask ventilation without difficulty Laryngoscope Size: Mac and 3 Grade View: Grade I Tube type: Oral Tube size: 7.0 mm Number of attempts: 1 Airway Equipment and Method: oral airway Placement Confirmation: ETT inserted through vocal cords under direct vision,  positive ETCO2 and breath sounds checked- equal and bilateral Secured at: 20 cm Tube secured with: Tape Dental Injury: Teeth and Oropharynx as per pre-operative assessment

## 2013-05-08 NOTE — Interval H&P Note (Signed)
History and Physical Interval Note:  05/08/2013 8:35 AM  Amy Krause  has presented today for surgery, with the diagnosis of CHRONIC MASTOIDITIS  The various methods of treatment have been discussed with the patient and family. After consideration of risks, benefits and other options for treatment, the patient has consented to  Procedure(s): RIGHT TYMPANOMASTOIDECTOMY (Right) as a surgical intervention .  The patient's history has been reviewed, patient examined, no change in status, stable for surgery.  I have reviewed the patient's chart and labs.  Questions were answered to the patient's satisfaction.  I have spoken to the patient and her husband this morning. Her recent URI has resolved. She denies coughing, fever, etc. The patient's right ear has been marked by myself this morning.   Dorma Russell, Tamica Covell M

## 2013-05-08 NOTE — Anesthesia Preprocedure Evaluation (Addendum)
Anesthesia Evaluation  Patient identified by MRN, date of birth, ID band Patient awake    Reviewed: Allergy & Precautions, H&P , NPO status , Patient's Chart, lab work & pertinent test results  History of Anesthesia Complications Negative for: history of anesthetic complications  Airway        Dental   Pulmonary neg pulmonary ROS,          Cardiovascular negative cardio ROS      Neuro/Psych negative neurological ROS  negative psych ROS   GI/Hepatic   Endo/Other    Renal/GU      Musculoskeletal   Abdominal   Peds  Hematology   Anesthesia Other Findings   Reproductive/Obstetrics                             Anesthesia Physical Anesthesia Plan Anesthesia Quick Evaluation  

## 2013-05-08 NOTE — Transfer of Care (Signed)
Immediate Anesthesia Transfer of Care Note  Patient: Amy Krause  Procedure(s) Performed: Procedure(s): RIGHT  CANAL-UP TYMPANOMASTOIDECTOMY; MICRODISSECTION USING THE OR MICROSCOPE; PLACEMENT OF PAPARELLA TUBE IN RIGHT EAR  (Right)  Patient Location: PACU  Anesthesia Type:General  Level of Consciousness: awake, alert  and oriented  Airway & Oxygen Therapy: Patient Spontanous Breathing and Patient connected to face mask oxygen  Post-op Assessment: Report given to PACU RN and Post -op Vital signs reviewed and stable  Post vital signs: Reviewed and stable  Complications: No apparent anesthesia complications

## 2013-05-08 NOTE — Brief Op Note (Signed)
05/08/2013  11:46 AM  PATIENT:  Amy Krause  72 y.o. female  PRE-OPERATIVE DIAGNOSIS:  Chronic suppurative otitis media and CHRONIC MASTOIDITIS--RIGHT  POST-OPERATIVE DIAGNOSIS:   Chronic suppurative otitis media and CHRONIC MASTOIDITIS--RIGHT PROCEDURE:  Procedure(s): RIGHT  CANAL-UP TYMPANOMASTOIDECTOMY; MICRODISSECTION USING THE OR MICROSCOPE; PLACEMENT OF PAPARELLA TUBE IN RIGHT EAR  (Right)  SURGEON:  Surgeon(s) and Role:    * Carolan Shiver, MD - Primary  PHYSICIAN ASSISTANT: none  ASSISTANTS:    ANESTHESIA:   general  EBL:  Total I/O In: 1800 [I.V.:1800] Out: 400 [Urine:400]  BLOOD ADMINISTERED:none  DRAINS: Penrose drain in the right mastoid cavity   LOCAL MEDICATIONS USED:  XYLOCAINE   SPECIMEN:  Source of Specimen:  R mastoid and middle ear contents  DISPOSITION OF SPECIMEN:  PATHOLOGY  COUNTS:  YES  TOURNIQUET:  * No tourniquets in log *  DICTATION: .Other Dictation: Dictation Number [id number]  PLAN OF CARE: Admit for overnight observation  PATIENT DISPOSITION:  PACU - hemodynamically stable.   Delay start of Pharmacological VTE agent (>24hrs) due to surgical blood loss or risk of bleeding: yes

## 2013-05-09 MED ORDER — CEFAZOLIN SODIUM 1-5 GM-% IV SOLN
INTRAVENOUS | Status: AC
  Filled 2013-05-09: qty 50

## 2013-05-09 MED ORDER — OXYCODONE-ACETAMINOPHEN 5-325 MG PO TABS
ORAL_TABLET | ORAL | Status: AC
  Filled 2013-05-09: qty 1

## 2013-05-09 MED ORDER — CEFPROZIL 500 MG PO TABS: 500.0000 mg | ORAL_TABLET | Freq: Two times a day (BID) | ORAL | Status: AC

## 2013-05-09 MED ORDER — MUPIROCIN 2 % EX OINT: TOPICAL_OINTMENT | Freq: Three times a day (TID) | CUTANEOUS | Status: DC

## 2013-05-09 MED ORDER — BACITRACIN ZINC 500 UNIT/GM EX OINT
TOPICAL_OINTMENT | CUTANEOUS | Status: AC
  Filled 2013-05-09: qty 28.35

## 2013-05-09 MED ORDER — HYDROCODONE-ACETAMINOPHEN 5-300 MG PO TABS: 1.0000 | ORAL_TABLET | ORAL | Status: DC | PRN

## 2013-05-09 MED ORDER — MUPIROCIN 2 % EX OINT
TOPICAL_OINTMENT | CUTANEOUS | Status: AC
  Filled 2013-05-09: qty 22

## 2013-05-09 MED ORDER — TOBRAMYCIN-DEXAMETHASONE 0.3-0.1 % OP SUSP: OPHTHALMIC | Status: AC

## 2013-05-09 NOTE — Progress Notes (Signed)
S: Quiet night, no complaints of pain, nausea or vertigo, husband in room.  O: Awake, alert, responsive, VS stable, VIIth intact, no nystagmus      R postauricular incision stable, no hematoma, drain removed  A: 1. Stable postoperative course without complications     2. OK for DC today with husband  P: 1. DC today with husband      2. Return to office in one week, has appointment      3. Regular diet      4. Meds: Cefzil, Vicodin prn, mupirocin oint, tobradex drops AD      5. Continue home medications      6. Quiet indoor activity      7. Call [phone] for any problems directly related to procedure      8. DC status = stable

## 2013-05-09 NOTE — Op Note (Signed)
NAMEMarland Kitchen  COLISHA, REDLER NO.:  [id number]  MEDICAL RECORD NO.:  [id number]  LOCATION:                               FACILITY:  [facility]  PHYSICIAN:  Carolan Shiver, M.D.    DATE OF BIRTH:  December 25, 1940  DATE OF PROCEDURE:  05/08/2013 DATE OF DISCHARGE:  05/09/2013                              OPERATIVE REPORT   JUSTIFICATION FOR PROCEDURE:  Amy Krause is a 72 year old white female who is here today for a right canal tympanomastoidectomy to treat chronic suppurative otitis media and mastoiditis documented on CT scanning of her temporal bones.  The patient has had 1-year history of intermittent purulent drainage from her right ear.  She has had a lifelong history of chronic ear disease.  She had previously undergone a mastoidectomy elsewhere in the right side, mastoidectomy in the left ear in [place], and revision mastoidectomy in the left ear by myself many years ago.  Cultures had grown methicillin-sensitive Staph aureus.  The patient was treated with 8 weeks of clindamycin and TobraDex drops to no avail.  She continue to drain from the right ear.  CT scan of her temporal bones documented bilateral chronic mastoiditis.  She was recommended for a right canal tympanomastoidectomy today.  She underwent preoperative anesthesia clearance by her family physician, Dr. Kirby Funk.  Risks, complications, and alternatives of the procedure were explained to her.  Questions were invited and answered and informed consent was signed and witnessed.  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  She had 80% discrimination in the right ear and 100% discrimination in the left ear.  JUSTIFICATION FOR OUTPATIENT SETTING:  The patient's age and need for general endotracheal anesthesia.  JUSTIFICATION FOR OVERNIGHT STAY:  23  hours of observation of facial function status post tympanomastoidectomy.  PREOPERATIVE DIAGNOSIS:  Chronic suppurative otitis media and mastoiditis, right ear.  POSTOPERATIVE DIAGNOSIS:  Chronic suppurative otitis media and mastoiditis, right ear.  OPERATION: 1. Right canal tympanomastoidectomy  with insertion of a tube right     ear. 2. Microdissection using the operating room microscope.  SURGEON:  Carolan Shiver, M.D.  ANESTHESIA:  General endotracheal, Dr. Arta Bruce.  CRNA:  Alvino Chapel.  COMPLICATIONS:  None.  SUMMARY OF REPORT:  After the patient was taken to the operating room, she was placed in the supine position.  An IV had been begun in the holding area.  General IV induction was then performed by Dr. Michelle Piper and the patient was orally intubated by Joe without difficulty.  She was then properly positioned and monitored.  Elbows and ankles were padded with foam rubber and I initiated a time-out at 8:53 a.m.  Small amount of hair was then clipped in the right postauricular area. Her hair was taped and stockinette cap was applied.  A right postauricular incision was marked posterior to the previous incision line, which was immediately posterior to the crease.  The area was cleansed with 70% isopropyl alcohol, and the incision line was infiltrated with 4 mL of 1% Xylocaine  with 1:200,000 epinephrine. Silver wire needle electrodes were then inserted into the right orbicularis oris and oculi muscles and suprasternal notch and connected to a NIM facial nerve monitor preamplifier.  The circulating nurse, Windell Moulding, inserted a Foley catheter.  The patient's right ear and hemi-face were then prepped with Betadine and draped in the standard fashion for a tympanomastoidectomy.  Using the operating room microscope, the right ear canal was examined, was cleaned of a large amount of crust and debris.  There was previously placed Paparella type I tube in the anterior inferior quadrant  draining thick mucoid otorrhea.  The ear was suction evacuated and 4-quadrant ear canal blocks were performed with 0.7 mL of 1% Xylocaine with 1:200,000 epinephrine.  The canal was irrigated with saline.  A postauricular incision was then made and carried down through skin and subcutaneous tissue and scar.  A T-shaped incision was made in the muscular periosteal layer and anterior and posterior subperiosteal flaps were elevated.  The anterior flap was elevated to the edge of the ear canal.  There was a defect in the mastoid cortex where the previous mastoidotomy had been performed elsewhere.  The self-retaining retractors were placed and the mastoid cortex was removed with cutting burs using continuous suction irrigation, and a Midas Rex drill.  The antrum and retrofacial cell tract were found to be filled with chronically inflamed tissue fiber reactive tissue.  Complete mastoidectomy was performed.  Posterior canal wall was thinned and digastric tip was opened and the sigmoid sinus was skeletonized, digastric periosteum was identified inferiorly.  The tegmen mastoideum was cleaned of large amount of fibroreactive disease.  There was 1 area medially in the mid portion of the tegmen that had been previously exposed by the other surgeon.  There was some exposed dura approximately 1 cm2.  Sinodural cell tract was then cleaned, Trautman's triangle was opened.  Then the lateral semicircular canal was identified.  The dissection was carried anteriorly through the aditus at antrum to the attic.  The attic was filled with fibroreactive disease and what looked like cholesterol granuloma.  This was surrounding the ossicles.  The attic was cleaned and disease was removed from medial to the ossicles. The site was then copiously irrigated with bacitracin-containing saline.  Posterior canal wall skin was then dissected medially to the annulus. Middle ear was entered posterior superiorly with a curved  Turner needle. Chorda tympani nerve was not identified.  It appeared that it had been previously removed.  The middle ear was found to be engulfed in fibroreactive  tissue and a very thickened lining covering the promontory filling the hypotympanum.  The posterior superior bony canal wall was removed with a #2 diamond bur and a stapes curette.  I then proceeded to remove all the disease around the ossicles.  The long process of incus was engulfed in the disease as was the stapes.  Facial nerve was located and all the disease was removed from the canal, the bony canal was intact.  Disease was then removed from the oval window as well as from the stapes superstructure.  There was a large piece of myringosclerotic disease attached to the posterior crus.  Sinus tympani was then cleaned using sinus tympani excavators.  The very thickened polypoid middle ear mucosa, which was weeping a thick mucoid fluid was then removed from superior and anterior across the promontory, then from carotid plate and from hypotympanum.  Once all the disease was removed meticulously the ossicles remained intact and mobile.  The  mastoid middle ear were then copiously irrigated with bacitracin-containing saline, irrigation fluid flowed freely through the attic into the middle ear.  The previously placed Paparella type I tube was removed and a new tube was inserted through a transcanal approach.  All bleeding was then controlled.  A sliver Penrose drain was placed in the mastoid cavity, sutured to the skin with 3-0 chromic.  The posterior canal wall skin was then reapproximated to the canal wall and the canal was packed with quarter-inch iodoform Nu-Gauze impregnated with bacitracin ointment.  I should mention that I placed Ciprodex drops in the middle ear after finishing the case.  Postauricular incision was then closed in 3 layers using interrupted 3-0 chromics for the muscular periosteal layer and interrupted  inverted 3-0 chromic for subcutaneous layer, skin was closed with running locking 5-0 Novafil.  Bacitracin ointment was applied.  The ear was then padded with Telfa and cotton. Standard adult Glasscock mastoid dressing was then applied loosely in the standard fashion.  The circulating nurse proceeded to remove the Foley catheter prior to the patient awakening.  TOTAL FLUIDS:  1800 mL.  TOTAL BLOOD LOSS:  Less than 20 mL.  TOTAL URINE OUTPUT:  400 mL.  Sponge, needle, and cotton ball counts were correct at the termination of procedure.  The patient received Ancef 2 g IV, Zofran 4 mg IV at the beginning and end of procedure Decadron 10 mg IV.  Ms. Wojtaszek will be admitted to the PACU then to the 23-hour recovery care unit for overnight observation.  If stable overnight, drain will be removed in the morning and she will be discharged with her husband on May 09, 2013.  He will be instructed to return her to my office on May 15, 2013, at 2:10 p.m.  DISCHARGE MEDICATIONS:  Cefzil 500 mg p.o. b.i.d. x10 days with food, TobraDex drops, 3 drops D t.i.d. x1 week, Vicodin 5/300 #30, one to two p.o. q.4-6 hours p.r.n. pain, and mupirocin ointment 2% 22 g applied to the incision b.i.d. x1 week.  The patient will be instructed to follow a regular diet, keep her head elevated, and avoid water exposure right ear.  She is to call [phone] for any postoperative problems directly related to procedure.  She and her husband will be given both verbal and written instructions.  SUMMARY:  The patient was found to have chronic infection filling the mastoid, attic, and middle ear.  The ossicles were intact and mobile. After cleaning, chorda tympani nerve was not present.  Facial nerve was covered by bone in horizontal portion.  New Paparella type 1 tube was inserted. No prostheses or cement were implanted.  There were no complications. Microdissection using the operating room microscope was  utilized during the procedure.  All sponge, needle, and cotton ball counts were correct at the termination of the procedure.     Carolan Shiver, M.D.   ______________________________ Carolan Shiver, M.D.    EMK/MEDQ  D:  05/08/2013  T:  05/09/2013  Job:  [id number]  cc:   Thora Lance, M.D.

## 2013-05-09 NOTE — Addendum Note (Signed)
Addendum created 05/09/13 8119 by Jewel Baize Dewitte Vannice, CRNA   Modules edited: Anesthesia Responsible Staff

## 2013-05-09 NOTE — Discharge Summary (Signed)
Physician Discharge Summary  Patient ID: Amy Krause MRN: [id number] DOB/AGE: 01/18/1941 72 y.o.  Admit date: 05/08/2013 Discharge date: 05/09/2013  Admission Diagnoses: Chronic Suppurative Otitis Media and Mastoiditis, Right Ear  Discharge Diagnoses: Chronic Suppurative Otitis Media and Mastoiditis, Right Ear Active Problems:   * No active hospital problems. *   Complications None  Discharged Condition: stable  Hospital Course: Operation 05-08-13, R canal up tympanomastoidectomy, OR room #6 CDS                                Transferred to PACU in stable condition.                                Overnight observation, Rm #3. Recovery Care Unit - stable, no complications                                Facial function intact, no vertigo, voiding without complications, little pain, quiet night                                R postauricular drain removed on 05-09-13. Incision and right ear stable                                DC'ed on morning of 05-09-13, with husband, following last dose of ancef IV  Consults: None  Significant Diagnostic Studies: none  Treatments: IV hydration, antibiotics: Ancef, analgesia: Vicodin and surgery: see procedure above  Discharge Exam: Blood pressure 119/65, pulse 61, temperature 97.7 F (36.5 C), temperature source Oral, resp. rate 16, height 4\' 11"  (1.499 m), weight 75.807 kg (167 lb 2 oz), SpO2 93.00%.  R postauricular incision stable and without hematoma Drain removed. Facial function intact and symmetric No nystagmus   Disposition: Stable, discharged home with husband in stable condition Diet regular home diet  Room Locations : CDS preop, OR Rm #6, PACU, RCC Rm #3.  Activity quiet indoor activity x 1 wk.     Medication List    STOP taking these medications       azithromycin 250 MG tablet  Commonly known as:  ZITHROMAX     predniSONE 20 MG tablet  Commonly known as:  DELTASONE      TAKE these medications       calcium-vitamin D 500-200 MG-UNIT per tablet  Commonly known as:  OSCAL WITH D  Take 1 tablet by mouth daily with breakfast.     cefPROZIL 500 MG tablet  Commonly known as:  CEFZIL  Take 1 tablet (500 mg total) by mouth 2 (two) times daily.     guaiFENesin 600 MG 12 hr tablet  Commonly known as:  MUCINEX  Take 1,200 mg by mouth 2 (two) times daily.     Hydrocodone-Acetaminophen 5-300 MG Tabs  Take 1-2 tablets by mouth every 4 (four) hours as needed (mild to moderate pain).     levothyroxine 75 MCG tablet  Commonly known as:  SYNTHROID, LEVOTHROID  Take 75 mcg by mouth daily before breakfast.     multivitamin with minerals tablet  Take 1 tablet by mouth daily.     mupirocin ointment 2 %  Commonly known as:  [employer]  Apply topically 3 (three) times daily. Apply to right postauricular incision twice per day for 1 week     raloxifene 60 MG tablet  Commonly known as:  EVISTA  Take 60 mg by mouth every evening.     tobramycin-dexamethasone ophthalmic solution  Commonly known as:  TOBRADEX  Tobradex 3 drops in right ear three times per day for 7 days.           Follow-up Information   Follow up with Ottavio Norem M, MD In 7 days. (Follow-up with Dr. Dorma Russell in one week. Contact the office at [phone] for date and time)    Specialty:  Otolaryngology   Contact information:   [address] [place], [place] [address] [place] [address] [phone]       Signed: Ermalinda Barrios M 05/09/2013, 8:03 AM

## 2013-05-14 ENCOUNTER — Encounter (HOSPITAL_BASED_OUTPATIENT_CLINIC_OR_DEPARTMENT_OTHER): Payer: Self-pay | Admitting: Otolaryngology

## 2013-09-19 ENCOUNTER — Other Ambulatory Visit: Payer: Self-pay | Admitting: Gastroenterology

## 2013-11-19 IMAGING — MG MM SCREEN MAMMOGRAM BILATERAL
5 series · 5 of 5 positions shown · non-contrast
Comparison: Previous exam(s).

CLINICAL DATA: Screening.

DIGITAL SCREENING BILATERAL MAMMOGRAM WITH CAD

[R CC]
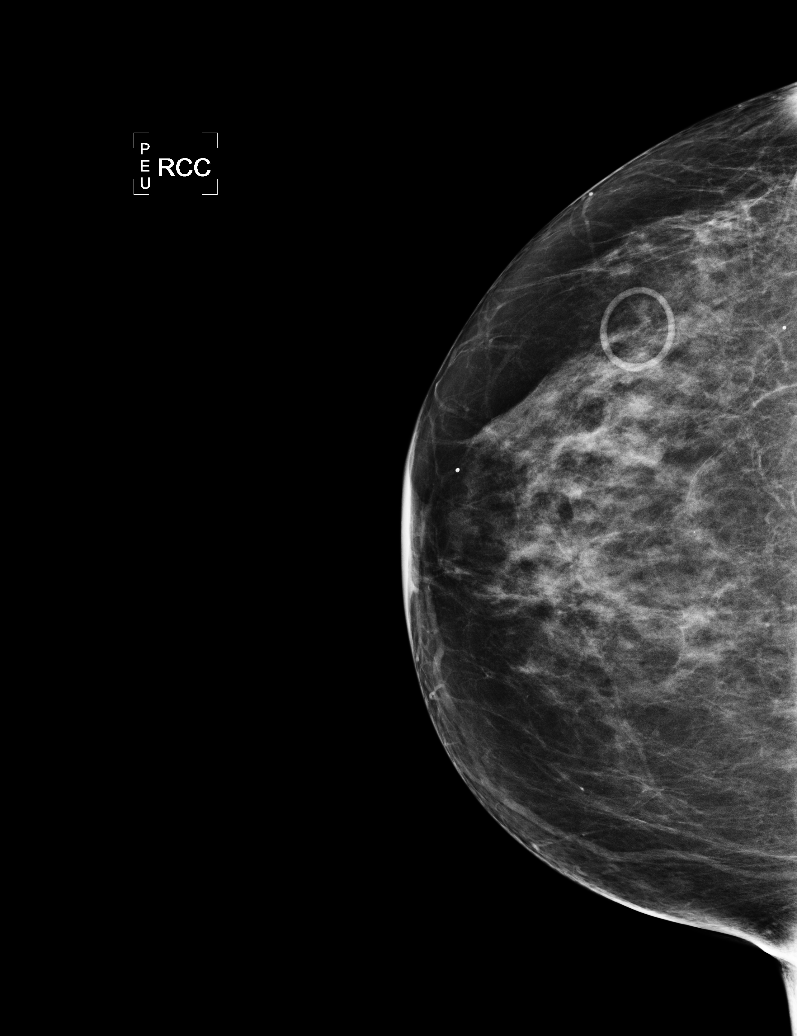

[L CC]
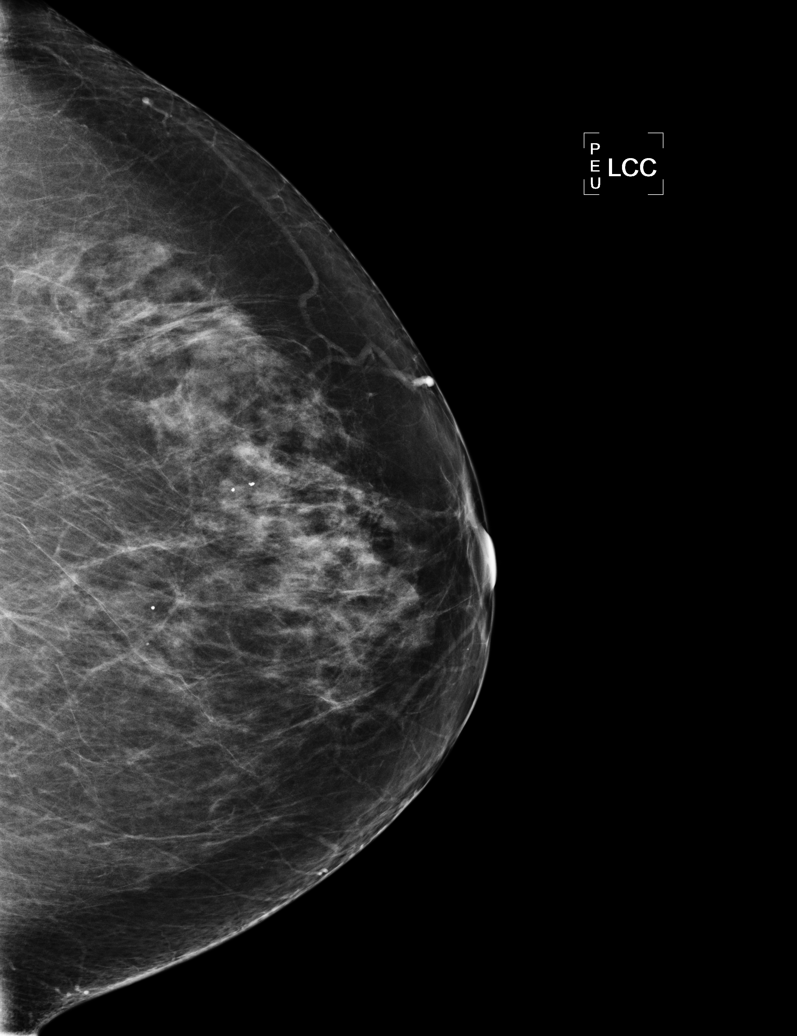

[L MLO]
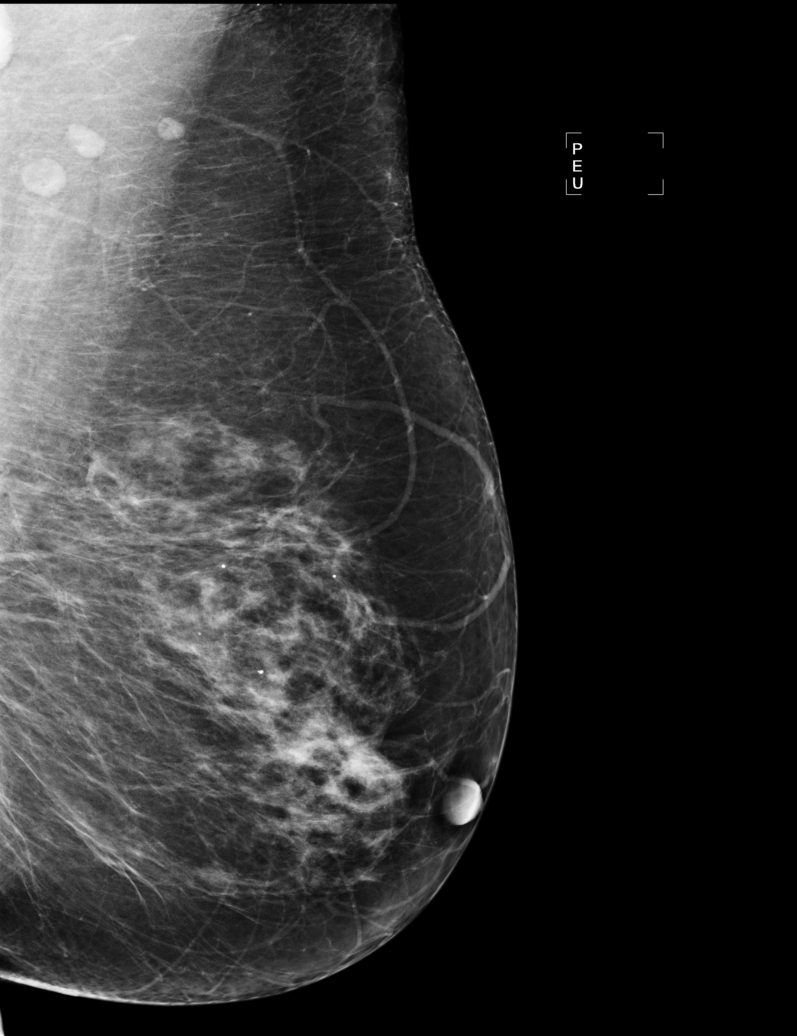

[R MLO (1 of 2)]
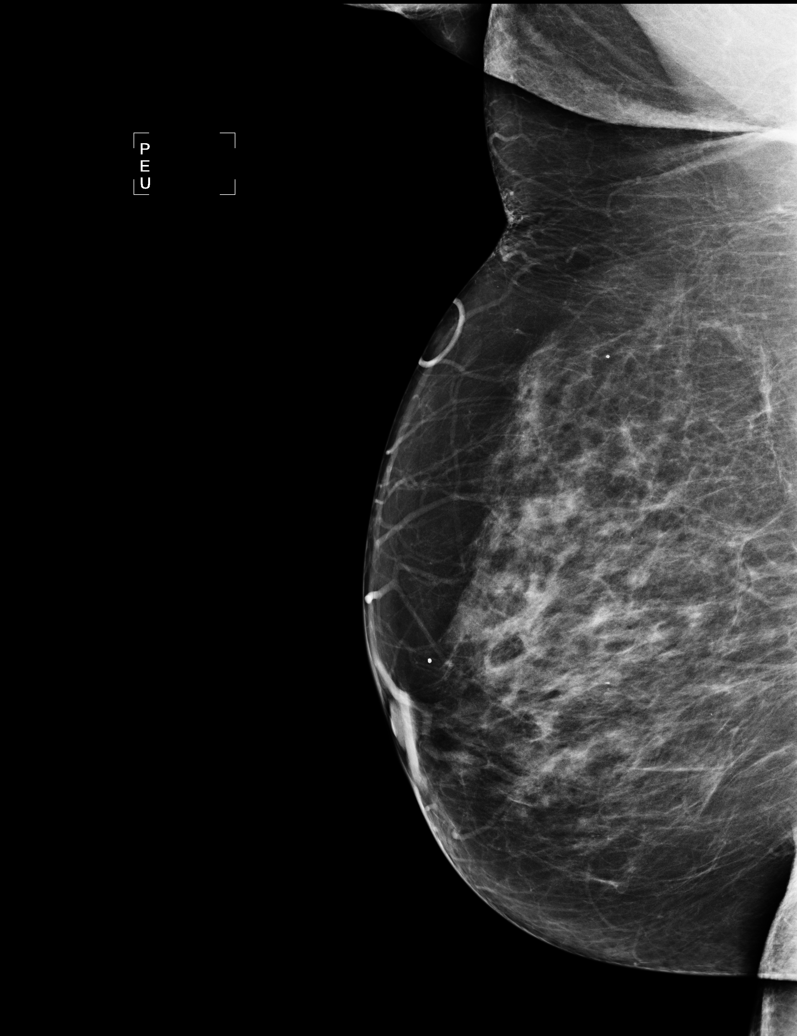

[R MLO (2 of 2)]
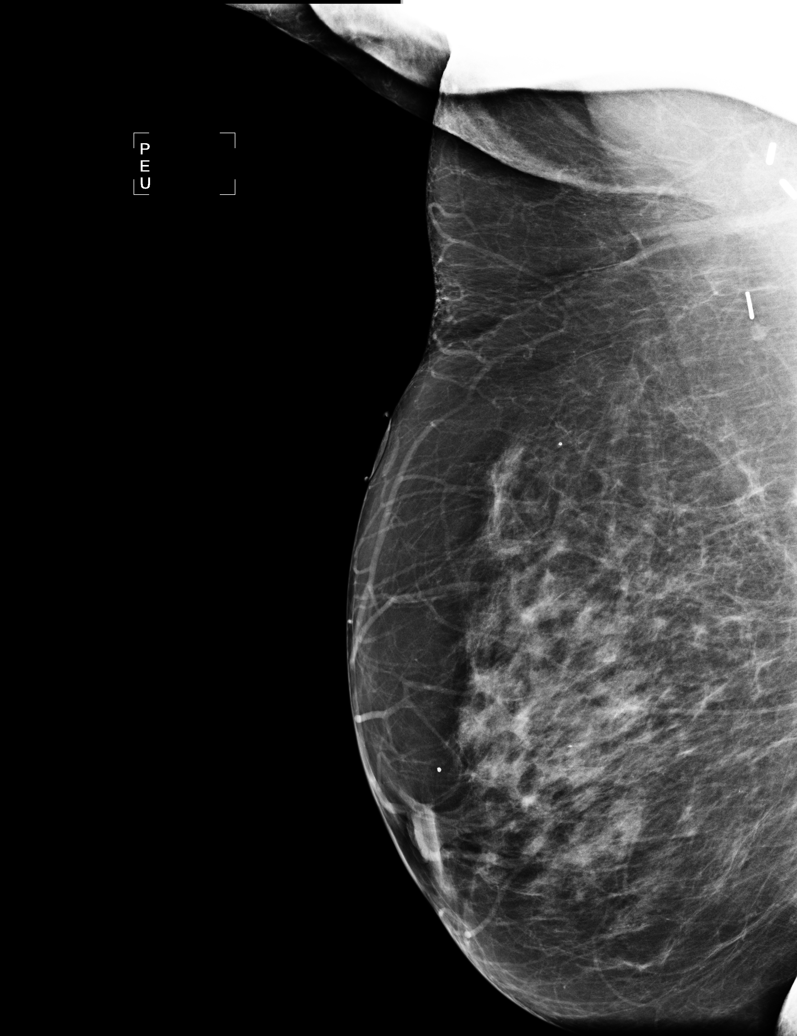

[5 of 5 positions shown; findings below may reference images not displayed]

FINDINGS: ACR Breast Density Category b:  There are scattered areas of
fibroglandular density.

There are no findings suspicious for malignancy.

Images were processed with CAD.
IMPRESSION: No mammographic evidence of malignancy.

A result letter of this screening mammogram will be mailed directly
to the patient.

RECOMMENDATION:
Screening mammogram in one year. (Code:RE-T-HFS)

BI-RADS CATEGORY 1:  Negative.

## 2014-08-30 ENCOUNTER — Other Ambulatory Visit: Payer: Self-pay | Admitting: Dermatology

## 2015-04-21 ENCOUNTER — Other Ambulatory Visit: Payer: Self-pay | Admitting: Obstetrics & Gynecology

## 2015-04-22 LAB — CYTOLOGY - PAP

## 2017-03-03 ENCOUNTER — Other Ambulatory Visit: Payer: Self-pay | Admitting: Physician Assistant

## 2017-03-03 DIAGNOSIS — R194 Change in bowel habit: Secondary | ICD-10-CM

## 2017-03-03 DIAGNOSIS — Z86010 Personal history of colonic polyps: Secondary | ICD-10-CM

## 2017-03-11 ENCOUNTER — Ambulatory Visit
Admission: RE | Admit: 2017-03-11 | Discharge: 2017-03-11 | Disposition: A | Discharge status: Home / Self Care | Payer: Medicare Other | Source: Ambulatory Visit | Attending: Physician Assistant | Admitting: Physician Assistant

## 2017-03-11 DIAGNOSIS — R194 Change in bowel habit: Secondary | ICD-10-CM

## 2017-03-11 DIAGNOSIS — Z86010 Personal history of colonic polyps: Secondary | ICD-10-CM

## 2017-10-26 IMAGING — RF DG BE W/ CM - WO/W KUB
1 series · 15 of 18 positions shown · IV contrast (agent unspecified)
Comparison: CT scan from 4991

CLINICAL DATA: Change in bowel habits.  Prior colonoscopy 3096.

EXAM:
BE WITH CONTRAST - WITHOUT AND WITH KUB
FLUOROSCOPY TIME:  Fluoroscopy Time:  6 minutes and 12 seconds
Radiation Exposure Index (if provided by the fluoroscopic device):
1,057 mGy
Number of Acquired Spot Images: 0

[Series 1: one shot · 15 of 18 slices shown]
[im 1/18]
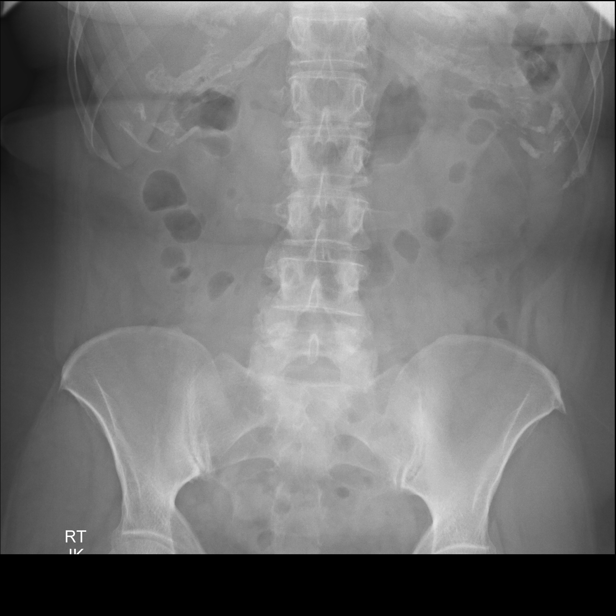
[im 2/18]
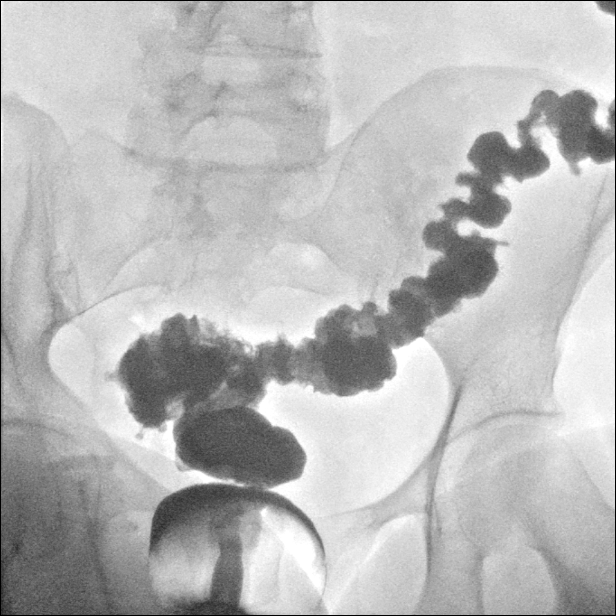
[im 4/18]
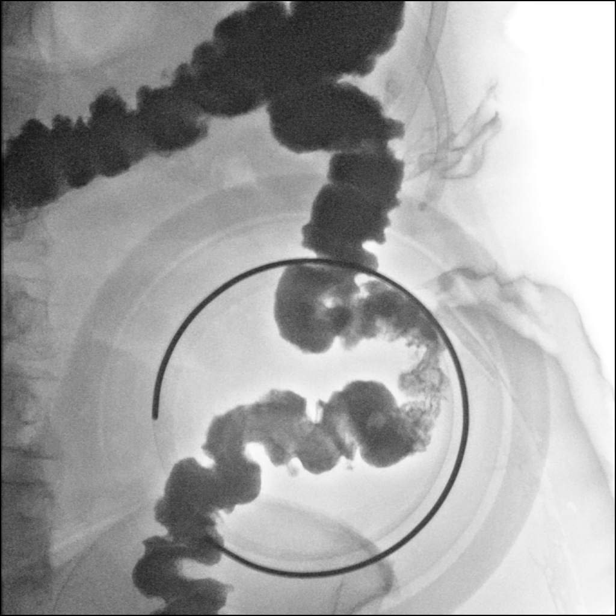
[im 5/18]
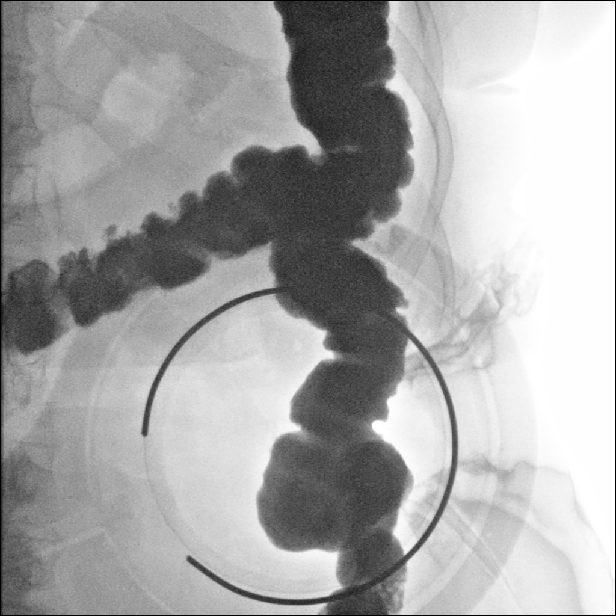
[im 6/18]
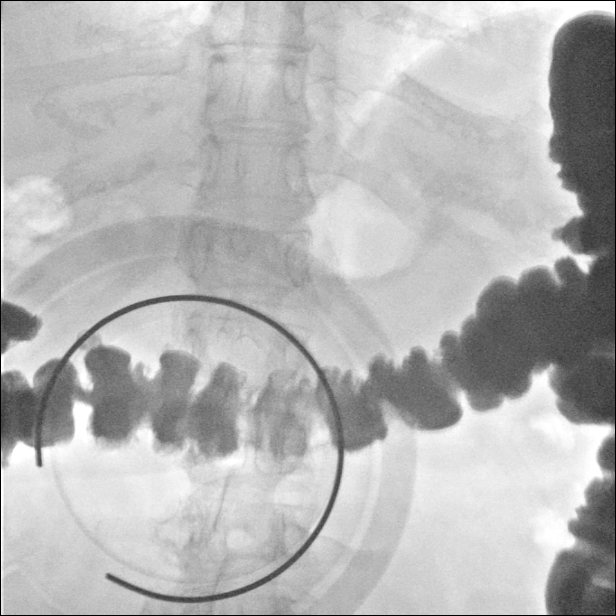
[im 7/18]
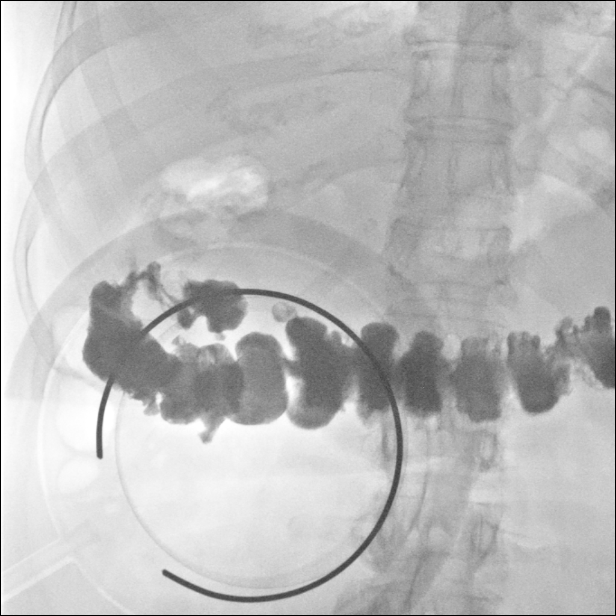
[im 8/18]
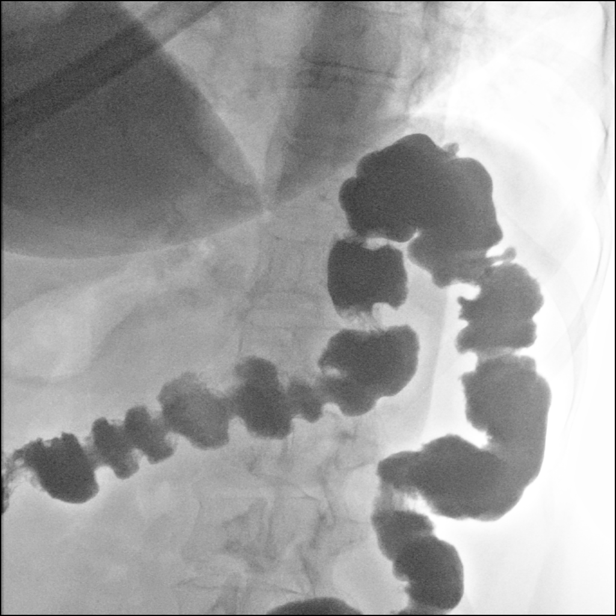
[im 10/18]
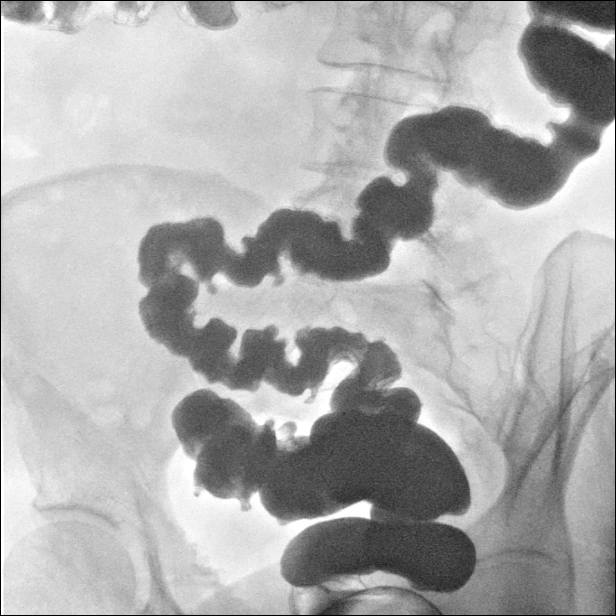
[im 11/18]
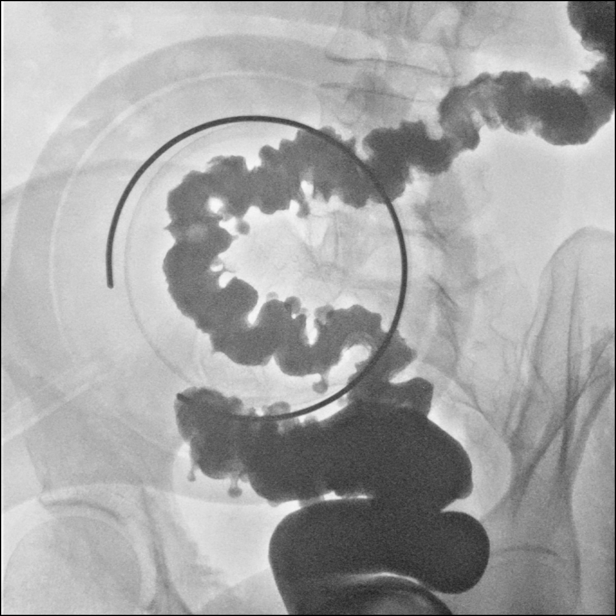
[im 12/18]
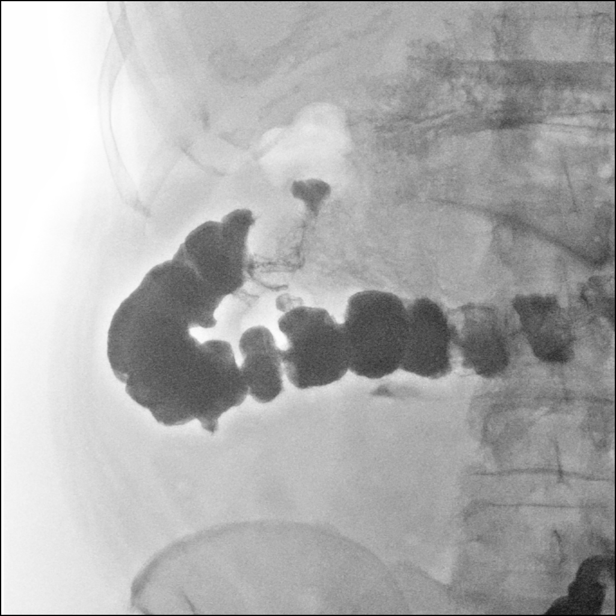
[im 13/18]
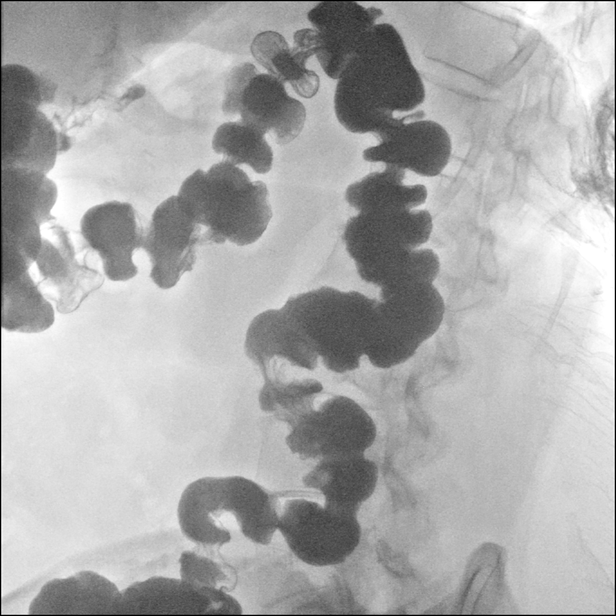
[im 14/18]
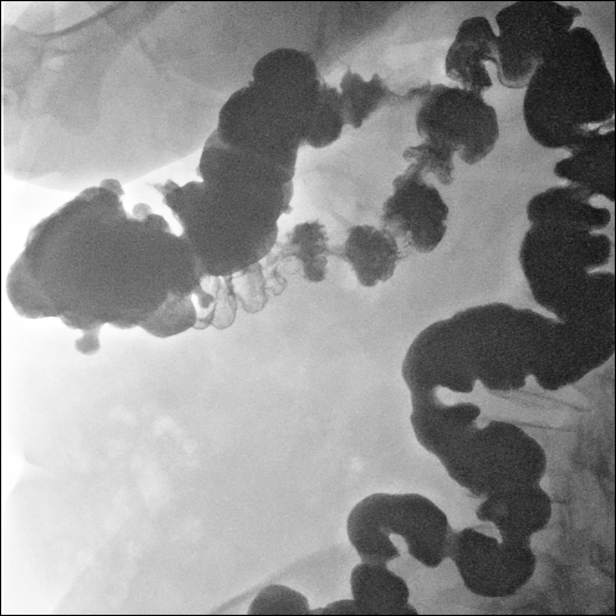
[im 16/18]
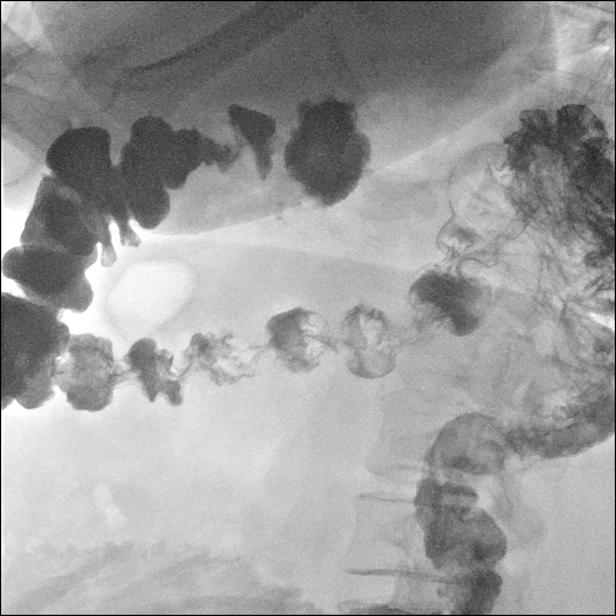
[im 17/18]
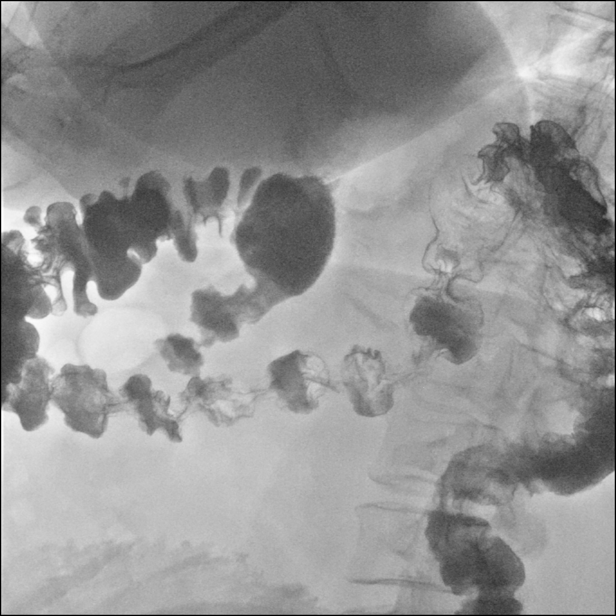
[im 18/18]
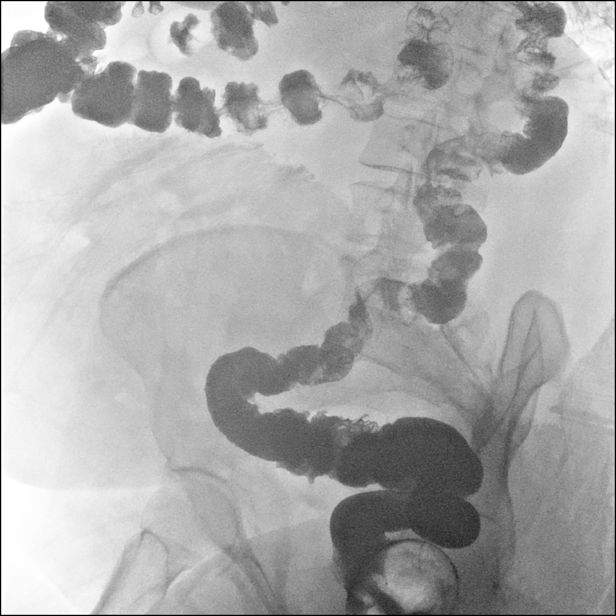

[15 of 18 positions shown; findings below may reference images not displayed]

FINDINGS: Very difficult examination. The patient was unable to hold onto the
barium and containing only leaked from the rectum. There is diffuse
colonic diverticulosis and also muscular wall thickening in the
sigmoid area. No obvious large fixed filling defects are identified.
However, I was never able to completely opacified the ascending
colon or cecum.
IMPRESSION: Limited examination. The ascending colon and cecum are not
visualized. Consider repeat colonoscopy depending on the clinical
situation.

Diffuse colonic diverticulosis.

No obvious large fixed filling defects are identified.

## 2018-12-01 DIAGNOSIS — H6993 Unspecified Eustachian tube disorder, bilateral: Secondary | ICD-10-CM | POA: Insufficient documentation

## 2018-12-01 DIAGNOSIS — H6983 Other specified disorders of Eustachian tube, bilateral: Secondary | ICD-10-CM | POA: Insufficient documentation

## 2019-04-06 DIAGNOSIS — IMO0001 Reserved for inherently not codable concepts without codable children: Secondary | ICD-10-CM | POA: Insufficient documentation

## 2019-06-14 ENCOUNTER — Other Ambulatory Visit: Payer: Self-pay

## 2019-06-14 ENCOUNTER — Encounter: Payer: Self-pay | Admitting: Podiatry

## 2019-06-14 ENCOUNTER — Ambulatory Visit: Payer: Medicare Other | Admitting: Podiatry

## 2019-06-14 ENCOUNTER — Ambulatory Visit (INDEPENDENT_AMBULATORY_CARE_PROVIDER_SITE_OTHER): Payer: Medicare Other

## 2019-06-14 VITALS — BP 121/69

## 2019-06-14 DIAGNOSIS — M722 Plantar fascial fibromatosis: Secondary | ICD-10-CM

## 2019-06-14 NOTE — Patient Instructions (Signed)
Plantar Fasciitis (Heel Spur Syndrome) with Rehab The plantar fascia is a fibrous, ligament-like, soft-tissue structure that spans the bottom of the foot. Plantar fasciitis is a condition that causes pain in the foot due to inflammation of the tissue. SYMPTOMS   Pain and tenderness on the underneath side of the foot.  Pain that worsens with standing or walking. CAUSES  Plantar fasciitis is caused by irritation and injury to the plantar fascia on the underneath side of the foot. Common mechanisms of injury include:  Direct trauma to bottom of the foot.  Damage to a small nerve that runs under the foot where the main fascia attaches to the heel bone.  Stress placed on the plantar fascia due to bone spurs. RISK INCREASES WITH:   Activities that place stress on the plantar fascia (running, jumping, pivoting, or cutting).  Poor strength and flexibility.  Improperly fitted shoes.  Tight calf muscles.  Flat feet.  Failure to warm-up properly before activity.  Obesity. PREVENTION  Warm up and stretch properly before activity.  Allow for adequate recovery between workouts.  Maintain physical fitness:  Strength, flexibility, and endurance.  Cardiovascular fitness.  Maintain a health body weight.  Avoid stress on the plantar fascia.  Wear properly fitted shoes, including arch supports for individuals who have flat feet.  PROGNOSIS  If treated properly, then the symptoms of plantar fasciitis usually resolve without surgery. However, occasionally surgery is necessary.  RELATED COMPLICATIONS   Recurrent symptoms that Jeiry result in a chronic condition.  Problems of the lower back that are caused by compensating for the injury, such as limping.  Pain or weakness of the foot during push-off following surgery.  Chronic inflammation, scarring, and partial or complete fascia tear, occurring more often from repeated injections.  TREATMENT  Treatment initially involves the  use of ice and medication to help reduce pain and inflammation. The use of strengthening and stretching exercises Hazelene help reduce pain with activity, especially stretches of the Achilles tendon. These exercises Anaysia be performed at home or with a therapist. Your caregiver Cloee recommend that you use heel cups of arch supports to help reduce stress on the plantar fascia. Occasionally, corticosteroid injections are given to reduce inflammation. If symptoms persist for greater than 6 months despite non-surgical (conservative), then surgery Miquela be recommended.   MEDICATION   If pain medication is necessary, then nonsteroidal anti-inflammatory medications, such as aspirin and ibuprofen, or other minor pain relievers, such as acetaminophen, are often recommended.  Do not take pain medication within 7 days before surgery.  Prescription pain relievers Leilynn be given if deemed necessary by your caregiver. Use only as directed and only as much as you need.  Corticosteroid injections Nychelle be given by your caregiver. These injections should be reserved for the most serious cases, because they Latessa only be given a certain number of times.  HEAT AND COLD  Cold treatment (icing) relieves pain and reduces inflammation. Cold treatment should be applied for 10 to 15 minutes every 2 to 3 hours for inflammation and pain and immediately after any activity that aggravates your symptoms. Use ice packs or massage the area with a piece of ice (ice massage).  Heat treatment Racheal be used prior to performing the stretching and strengthening activities prescribed by your caregiver, physical therapist, or athletic trainer. Use a heat pack or soak the injury in warm water.  SEEK IMMEDIATE MEDICAL CARE IF:  Treatment seems to offer no benefit, or the condition worsens.  Any medications   produce adverse side effects.  EXERCISES- RANGE OF MOTION (ROM) AND STRETCHING EXERCISES - Plantar Fasciitis (Heel Spur Syndrome) These exercises  Mickenzie help you when beginning to rehabilitate your injury. Your symptoms Kaysi resolve with or without further involvement from your physician, physical therapist or athletic trainer. While completing these exercises, remember:   Restoring tissue flexibility helps normal motion to return to the joints. This allows healthier, less painful movement and activity.  An effective stretch should be held for at least 30 seconds.  A stretch should never be painful. You should only feel a gentle lengthening or release in the stretched tissue.  RANGE OF MOTION - Toe Extension, Flexion  Sit with your right / left leg crossed over your opposite knee.  Grasp your toes and gently pull them back toward the top of your foot. You should feel a stretch on the bottom of your toes and/or foot.  Hold this stretch for 10 seconds.  Now, gently pull your toes toward the bottom of your foot. You should feel a stretch on the top of your toes and or foot.  Hold this stretch for 10 seconds. Repeat  times. Complete this stretch 3 times per day.   RANGE OF MOTION - Ankle Dorsiflexion, Active Assisted  Remove shoes and sit on a chair that is preferably not on a carpeted surface.  Place right / left foot under knee. Extend your opposite leg for support.  Keeping your heel down, slide your right / left foot back toward the chair until you feel a stretch at your ankle or calf. If you do not feel a stretch, slide your bottom forward to the edge of the chair, while still keeping your heel down.  Hold this stretch for 10 seconds. Repeat 3 times. Complete this stretch 2 times per day.   STRETCH  Gastroc, Standing  Place hands on wall.  Extend right / left leg, keeping the front knee somewhat bent.  Slightly point your toes inward on your back foot.  Keeping your right / left heel on the floor and your knee straight, shift your weight toward the wall, not allowing your back to arch.  You should feel a gentle stretch  in the right / left calf. Hold this position for 10 seconds. Repeat 3 times. Complete this stretch 2 times per day.  STRETCH  Soleus, Standing  Place hands on wall.  Extend right / left leg, keeping the other knee somewhat bent.  Slightly point your toes inward on your back foot.  Keep your right / left heel on the floor, bend your back knee, and slightly shift your weight over the back leg so that you feel a gentle stretch deep in your back calf.  Hold this position for 10 seconds. Repeat 3 times. Complete this stretch 2 times per day.  STRETCH  Gastrocsoleus, Standing  Note: This exercise can place a lot of stress on your foot and ankle. Please complete this exercise only if specifically instructed by your caregiver.   Place the ball of your right / left foot on a step, keeping your other foot firmly on the same step.  Hold on to the wall or a rail for balance.  Slowly lift your other foot, allowing your body weight to press your heel down over the edge of the step.  You should feel a stretch in your right / left calf.  Hold this position for 10 seconds.  Repeat this exercise with a slight bend in your right /   left knee. Repeat 3 times. Complete this stretch 2 times per day.   STRENGTHENING EXERCISES - Plantar Fasciitis (Heel Spur Syndrome)  These exercises Toniyah help you when beginning to rehabilitate your injury. They Farrie resolve your symptoms with or without further involvement from your physician, physical therapist or athletic trainer. While completing these exercises, remember:   Muscles can gain both the endurance and the strength needed for everyday activities through controlled exercises.  Complete these exercises as instructed by your physician, physical therapist or athletic trainer. Progress the resistance and repetitions only as guided.  STRENGTH - Towel Curls  Sit in a chair positioned on a non-carpeted surface.  Place your foot on a towel, keeping your heel  on the floor.  Pull the towel toward your heel by only curling your toes. Keep your heel on the floor. Repeat 3 times. Complete this exercise 2 times per day.  STRENGTH - Ankle Inversion  Secure one end of a rubber exercise band/tubing to a fixed object (table, pole). Loop the other end around your foot just before your toes.  Place your fists between your knees. This will focus your strengthening at your ankle.  Slowly, pull your big toe up and in, making sure the band/tubing is positioned to resist the entire motion.  Hold this position for 10 seconds.  Have your muscles resist the band/tubing as it slowly pulls your foot back to the starting position. Repeat 3 times. Complete this exercises 2 times per day.  Document Released: 07/12/2005 Document Revised: 10/04/2011 Document Reviewed: 10/24/2008 [employer] Patient Information 2014 [employer].  

## 2019-06-18 NOTE — Progress Notes (Signed)
Subjective:   Patient ID: Amy Krause, female   DOB: 78 y.o.   MRN: [id number]   HPI Patient presents stating she is had a lot of pain in her right heel for about a year.  She does have her own x-rays and states that this is gradually become more of an issue for her over the last period of time and she is having trouble with ambulation.  Patient does not smoke likes to be active   Review of Systems  All other systems reviewed and are negative.       Objective:  Physical Exam Vitals signs and nursing note reviewed.  Constitutional:      Appearance: She is well-developed.  Pulmonary:     Effort: Pulmonary effort is normal.  Musculoskeletal: Normal range of motion.  Skin:    General: Skin is warm.  Neurological:     Mental Status: She is alert.     Neurovascular status found to be intact muscle strength is found to be adequate for her age range of motion moderate moderately diminished.  Patient is noted to have exquisite discomfort medial fascial band right at the insertional point of the tendon into the calcaneus and states that there is been a worsening of this condition over the last few months.  Patient has good digital perfusion well oriented x3     Assessment:  Acute plantar fasciitis right with inflammation fluid around the medial band     Plan:  H&P reviewed condition and today I did a sterile prep and injected the fascia 3 mg Kenalog 5 mg Xylocaine and applied fascial brace to reduce pressure on the arch.  Patient will be seen back to recheck  X-ray indicates small spur does not indicate stress fracture or arthritis

## 2019-06-28 ENCOUNTER — Ambulatory Visit: Payer: Medicare Other | Admitting: Podiatry

## 2019-06-28 ENCOUNTER — Other Ambulatory Visit: Payer: Self-pay

## 2019-06-28 ENCOUNTER — Encounter: Payer: Self-pay | Admitting: Podiatry

## 2019-06-28 DIAGNOSIS — M722 Plantar fascial fibromatosis: Secondary | ICD-10-CM | POA: Diagnosis not present

## 2019-06-29 NOTE — Progress Notes (Signed)
Subjective:   Patient ID: Amy Krause, female   DOB: 78 y.o.   MRN: [id number]   HPI Patient states she is improving but is still having discomfort when I press deep into the fascia   ROS      Objective:  Physical Exam  Neurovascular status intact with patient still having discomfort deep into the plantar fascial right at the insertion of the tendon into the calcaneus     Assessment:  Plantar fasciitis right improved but present     Plan:  Reviewed condition and at this point I did find 1 area of acute inflammation.  We injected 3 mg Kenalog 5 mg Xylocaine and then went ahead advised on continued shoe gear modification support and will be seen back 4 weeks or earlier if needed

## 2019-07-06 DIAGNOSIS — H90A31 Mixed conductive and sensorineural hearing loss, unilateral, right ear with restricted hearing on the contralateral side: Secondary | ICD-10-CM | POA: Insufficient documentation

## 2019-07-25 ENCOUNTER — Ambulatory Visit: Payer: Medicare Other | Admitting: Podiatry

## 2019-07-25 ENCOUNTER — Other Ambulatory Visit: Payer: Self-pay

## 2019-07-25 ENCOUNTER — Encounter: Payer: Self-pay | Admitting: Podiatry

## 2019-07-25 DIAGNOSIS — M722 Plantar fascial fibromatosis: Secondary | ICD-10-CM

## 2019-07-25 NOTE — Progress Notes (Signed)
Subjective:   Patient ID: Amy Krause, female   DOB: 78 y.o.   MRN: [id number]   HPI Patient presents stating of doing pretty good with minimal discomfort and states that she so far has a significant reduction of her heel pain   ROS      Objective:  Physical Exam  Neurovascular status intact with patient's right heel quite a bit improved from previous treatment with inflammation only upon deep palpation with a fairly narrow heel and diminished fat pad     Assessment:  Doing well postop plantar fascial treatment right with mild pain remaining     Plan:  H&P spent a great deal time going over a conservative course for her and the fact there is no guarantees as to how she will respond its possible long-term she will require more aggressive treatment plan.  Patient is discharged is encouraged to call with questions concerns and hopefully this will be the end of her problems

## 2019-08-10 ENCOUNTER — Ambulatory Visit: Payer: Medicare Other | Attending: Internal Medicine

## 2019-08-10 DIAGNOSIS — Z23 Encounter for immunization: Secondary | ICD-10-CM

## 2019-08-10 NOTE — Progress Notes (Signed)
   Covid-19 Vaccination Clinic  Name:  Amy Krause    MRN: [id number] DOB: 26-May-1941  08/10/2019  [name] was observed post Covid-19 immunization for 15 minutes without incidence. She was provided with Vaccine Information Sheet and instruction to access the V-Safe system.   [name] was instructed to call 911 with any severe reactions post vaccine: Marland Kitchen Difficulty breathing  . Swelling of your face and throat  . A fast heartbeat  . A bad rash all over your body  . Dizziness and weakness    Immunizations Administered    Name Date Dose VIS Date Route   Pfizer COVID-19 Vaccine 08/10/2019 10:04 AM 0.3 mL 07/06/2019 Intramuscular   Manufacturer: [place]   Lot: [id number]   [place]: [id number]

## 2019-08-31 ENCOUNTER — Ambulatory Visit: Payer: Medicare Other | Attending: Internal Medicine

## 2019-08-31 DIAGNOSIS — Z23 Encounter for immunization: Secondary | ICD-10-CM | POA: Insufficient documentation

## 2019-08-31 NOTE — Progress Notes (Signed)
   Covid-19 Vaccination Clinic  Name:  Amy Krause    MRN: [id number] DOB: May 30, 1941  08/31/2019  Amy Krause was observed post Covid-19 immunization for 15 minutes without incidence. She was provided with Vaccine Information Sheet and instruction to access the V-Safe system.   Amy Krause was instructed to call 911 with any severe reactions post vaccine: Marland Kitchen Difficulty breathing  . Swelling of your face and throat  . A fast heartbeat  . A bad rash all over your body  . Dizziness and weakness    Immunizations Administered    Name Date Dose VIS Date Route   Pfizer COVID-19 Vaccine 08/31/2019  9:24 AM 0.3 mL 07/06/2019 Intramuscular   Manufacturer: [place]   Lot: [id number]   [place]: [id number]

## 2019-09-27 DIAGNOSIS — H608X2 Other otitis externa, left ear: Secondary | ICD-10-CM | POA: Insufficient documentation

## 2019-11-08 ENCOUNTER — Ambulatory Visit: Payer: Medicare Other | Admitting: Podiatry

## 2019-11-21 ENCOUNTER — Ambulatory Visit: Payer: Medicare Other | Admitting: Podiatry

## 2019-11-21 ENCOUNTER — Encounter: Payer: Self-pay | Admitting: Podiatry

## 2019-11-21 ENCOUNTER — Other Ambulatory Visit: Payer: Self-pay

## 2019-11-21 VITALS — Temp 97.4°F

## 2019-11-21 DIAGNOSIS — M722 Plantar fascial fibromatosis: Secondary | ICD-10-CM | POA: Diagnosis not present

## 2019-11-21 NOTE — Progress Notes (Signed)
Subjective:   Patient ID: Amy Krause, female   DOB: 79 y.o.   MRN: [id number]   HPI Patient states she was doing better but over the last month it started to hurt and she states she has been wearing her night splint which helps some but has gotten sore and she is frustrated neuro   ROS      Objective:  Physical Exam  Vascular status intact with patient's right plantar heel inflamed began at the insertional point tendon calcaneus with localized inflammation noted      Assessment:  Reoccurrence plantar fasciitis right acute     Plan:  Reviewed condition repeat injected the plantar fascia 3 mg Kenalog 5 mg Xylocaine discussed possible shockwave therapy or surgery depending on response and will see back shoes [place]

## 2020-01-09 ENCOUNTER — Ambulatory Visit: Payer: Medicare Other | Admitting: Podiatry

## 2020-01-09 ENCOUNTER — Encounter: Payer: Self-pay | Admitting: Podiatry

## 2020-01-09 ENCOUNTER — Other Ambulatory Visit: Payer: Self-pay

## 2020-01-09 DIAGNOSIS — M722 Plantar fascial fibromatosis: Secondary | ICD-10-CM

## 2020-01-09 NOTE — Progress Notes (Signed)
Subjective:   Patient ID: Amy Krause, female   DOB: 79 y.o.   MRN: [id number]   HPI Patient states she is feeling so much better and at this point she is very happy but she wants to discuss ways to prevent it from reoccurring   ROS      Objective:  Physical Exam  Neurovascular status intact with patient's right heel doing much better with pain that is receded with discomfort upon deep palpation but minimal with moderate fat pad diminishment     Assessment:  Doing well after having had chronic plantar fasciitis right     Plan:  Spent a great deal of time educating her on condition considerations for topical medicines oral continuation of night splint shoe gear modification and stretching.  Explained shockwave and surgery if symptoms were to get worse and discussed that hopefully this will be the end of his symptoms and I will be available as symptoms indicate

## 2020-04-02 ENCOUNTER — Encounter: Payer: Self-pay | Admitting: Podiatry

## 2020-04-02 ENCOUNTER — Ambulatory Visit: Payer: Medicare Other | Admitting: Podiatry

## 2020-04-02 ENCOUNTER — Other Ambulatory Visit: Payer: Self-pay

## 2020-04-02 DIAGNOSIS — M722 Plantar fascial fibromatosis: Secondary | ICD-10-CM

## 2020-04-02 NOTE — Progress Notes (Signed)
Subjective:   Patient ID: Amy Krause, female   DOB: 79 y.o.   MRN: [id number]   HPI Patient states she was doing great and then she had a flareup of her plantar fasciitis   ROS      Objective:  Physical Exam  Neurovascular status intact with exquisite discomfort plantar fascial right with patient not remembering what she did and admitting she has not been using her night splint as we had advised     Assessment:  Acute plantar fasciitis right reoccurrence     Plan:  Discussed possibility for shockwave if surgery symptoms persist but today did sterile prep reinjected the fascia 3 mg Kenalog 5 mg Xylocaine continue night splint reappoint as symptoms indicate

## 2021-07-29 DIAGNOSIS — J3089 Other allergic rhinitis: Secondary | ICD-10-CM | POA: Diagnosis not present

## 2021-07-29 DIAGNOSIS — J301 Allergic rhinitis due to pollen: Secondary | ICD-10-CM | POA: Diagnosis not present

## 2021-08-03 DIAGNOSIS — J301 Allergic rhinitis due to pollen: Secondary | ICD-10-CM | POA: Diagnosis not present

## 2021-08-03 DIAGNOSIS — J3089 Other allergic rhinitis: Secondary | ICD-10-CM | POA: Diagnosis not present

## 2021-08-20 DIAGNOSIS — R509 Fever, unspecified: Secondary | ICD-10-CM | POA: Diagnosis not present

## 2021-08-20 DIAGNOSIS — Z03818 Encounter for observation for suspected exposure to other biological agents ruled out: Secondary | ICD-10-CM | POA: Diagnosis not present

## 2021-09-04 DIAGNOSIS — J3089 Other allergic rhinitis: Secondary | ICD-10-CM | POA: Diagnosis not present

## 2021-09-04 DIAGNOSIS — J301 Allergic rhinitis due to pollen: Secondary | ICD-10-CM | POA: Diagnosis not present

## 2021-09-08 DIAGNOSIS — J301 Allergic rhinitis due to pollen: Secondary | ICD-10-CM | POA: Diagnosis not present

## 2021-09-08 DIAGNOSIS — J3089 Other allergic rhinitis: Secondary | ICD-10-CM | POA: Diagnosis not present

## 2021-09-10 DIAGNOSIS — J301 Allergic rhinitis due to pollen: Secondary | ICD-10-CM | POA: Diagnosis not present

## 2021-09-10 DIAGNOSIS — J3089 Other allergic rhinitis: Secondary | ICD-10-CM | POA: Diagnosis not present

## 2021-09-16 DIAGNOSIS — J301 Allergic rhinitis due to pollen: Secondary | ICD-10-CM | POA: Diagnosis not present

## 2021-09-16 DIAGNOSIS — J3089 Other allergic rhinitis: Secondary | ICD-10-CM | POA: Diagnosis not present

## 2021-09-17 DIAGNOSIS — H6983 Other specified disorders of Eustachian tube, bilateral: Secondary | ICD-10-CM | POA: Diagnosis not present

## 2021-09-17 DIAGNOSIS — H608X2 Other otitis externa, left ear: Secondary | ICD-10-CM | POA: Diagnosis not present

## 2021-09-17 DIAGNOSIS — H903 Sensorineural hearing loss, bilateral: Secondary | ICD-10-CM | POA: Diagnosis not present

## 2021-09-17 DIAGNOSIS — H9111 Presbycusis, right ear: Secondary | ICD-10-CM | POA: Diagnosis not present

## 2021-09-17 DIAGNOSIS — Z9622 Myringotomy tube(s) status: Secondary | ICD-10-CM | POA: Diagnosis not present

## 2021-09-17 DIAGNOSIS — Z9889 Other specified postprocedural states: Secondary | ICD-10-CM | POA: Diagnosis not present

## 2021-09-17 DIAGNOSIS — H90A31 Mixed conductive and sensorineural hearing loss, unilateral, right ear with restricted hearing on the contralateral side: Secondary | ICD-10-CM | POA: Diagnosis not present

## 2021-09-17 DIAGNOSIS — H906 Mixed conductive and sensorineural hearing loss, bilateral: Secondary | ICD-10-CM | POA: Diagnosis not present

## 2021-09-17 DIAGNOSIS — H938X3 Other specified disorders of ear, bilateral: Secondary | ICD-10-CM | POA: Diagnosis not present

## 2021-09-17 DIAGNOSIS — Z974 Presence of external hearing-aid: Secondary | ICD-10-CM | POA: Diagnosis not present

## 2021-09-21 DIAGNOSIS — J3089 Other allergic rhinitis: Secondary | ICD-10-CM | POA: Diagnosis not present

## 2021-09-21 DIAGNOSIS — J301 Allergic rhinitis due to pollen: Secondary | ICD-10-CM | POA: Diagnosis not present

## 2021-10-05 DIAGNOSIS — J301 Allergic rhinitis due to pollen: Secondary | ICD-10-CM | POA: Diagnosis not present

## 2021-10-05 DIAGNOSIS — J3089 Other allergic rhinitis: Secondary | ICD-10-CM | POA: Diagnosis not present

## 2021-10-16 DIAGNOSIS — J3089 Other allergic rhinitis: Secondary | ICD-10-CM | POA: Diagnosis not present

## 2021-10-16 DIAGNOSIS — J301 Allergic rhinitis due to pollen: Secondary | ICD-10-CM | POA: Diagnosis not present

## 2021-10-28 DIAGNOSIS — N3281 Overactive bladder: Secondary | ICD-10-CM | POA: Diagnosis not present

## 2021-11-02 DIAGNOSIS — J3089 Other allergic rhinitis: Secondary | ICD-10-CM | POA: Diagnosis not present

## 2021-11-02 DIAGNOSIS — J301 Allergic rhinitis due to pollen: Secondary | ICD-10-CM | POA: Diagnosis not present

## 2021-11-12 DIAGNOSIS — J3089 Other allergic rhinitis: Secondary | ICD-10-CM | POA: Diagnosis not present

## 2021-11-12 DIAGNOSIS — J301 Allergic rhinitis due to pollen: Secondary | ICD-10-CM | POA: Diagnosis not present

## 2021-11-26 DIAGNOSIS — J3089 Other allergic rhinitis: Secondary | ICD-10-CM | POA: Diagnosis not present

## 2021-11-26 DIAGNOSIS — J3081 Allergic rhinitis due to animal (cat) (dog) hair and dander: Secondary | ICD-10-CM | POA: Diagnosis not present

## 2021-11-26 DIAGNOSIS — J301 Allergic rhinitis due to pollen: Secondary | ICD-10-CM | POA: Diagnosis not present

## 2021-11-27 DIAGNOSIS — K219 Gastro-esophageal reflux disease without esophagitis: Secondary | ICD-10-CM | POA: Diagnosis not present

## 2021-12-15 DIAGNOSIS — J3089 Other allergic rhinitis: Secondary | ICD-10-CM | POA: Diagnosis not present

## 2021-12-15 DIAGNOSIS — J3081 Allergic rhinitis due to animal (cat) (dog) hair and dander: Secondary | ICD-10-CM | POA: Diagnosis not present

## 2021-12-15 DIAGNOSIS — J301 Allergic rhinitis due to pollen: Secondary | ICD-10-CM | POA: Diagnosis not present

## 2021-12-17 DIAGNOSIS — H608X2 Other otitis externa, left ear: Secondary | ICD-10-CM | POA: Diagnosis not present

## 2021-12-17 DIAGNOSIS — H6983 Other specified disorders of Eustachian tube, bilateral: Secondary | ICD-10-CM | POA: Diagnosis not present

## 2021-12-17 DIAGNOSIS — H90A31 Mixed conductive and sensorineural hearing loss, unilateral, right ear with restricted hearing on the contralateral side: Secondary | ICD-10-CM | POA: Diagnosis not present

## 2021-12-17 DIAGNOSIS — Z9889 Other specified postprocedural states: Secondary | ICD-10-CM | POA: Diagnosis not present

## 2021-12-17 DIAGNOSIS — H903 Sensorineural hearing loss, bilateral: Secondary | ICD-10-CM | POA: Diagnosis not present

## 2021-12-17 DIAGNOSIS — J301 Allergic rhinitis due to pollen: Secondary | ICD-10-CM | POA: Diagnosis not present

## 2021-12-17 DIAGNOSIS — Z79899 Other long term (current) drug therapy: Secondary | ICD-10-CM | POA: Diagnosis not present

## 2021-12-17 DIAGNOSIS — H6993 Unspecified Eustachian tube disorder, bilateral: Secondary | ICD-10-CM | POA: Diagnosis not present

## 2021-12-17 DIAGNOSIS — H938X3 Other specified disorders of ear, bilateral: Secondary | ICD-10-CM | POA: Diagnosis not present

## 2021-12-17 DIAGNOSIS — Z974 Presence of external hearing-aid: Secondary | ICD-10-CM | POA: Diagnosis not present

## 2021-12-17 DIAGNOSIS — Z9622 Myringotomy tube(s) status: Secondary | ICD-10-CM | POA: Diagnosis not present

## 2021-12-24 DIAGNOSIS — J3089 Other allergic rhinitis: Secondary | ICD-10-CM | POA: Diagnosis not present

## 2021-12-24 DIAGNOSIS — J301 Allergic rhinitis due to pollen: Secondary | ICD-10-CM | POA: Diagnosis not present

## 2021-12-30 DIAGNOSIS — J301 Allergic rhinitis due to pollen: Secondary | ICD-10-CM | POA: Diagnosis not present

## 2021-12-30 DIAGNOSIS — J3089 Other allergic rhinitis: Secondary | ICD-10-CM | POA: Diagnosis not present

## 2021-12-30 DIAGNOSIS — K219 Gastro-esophageal reflux disease without esophagitis: Secondary | ICD-10-CM | POA: Diagnosis not present

## 2022-01-05 DIAGNOSIS — J3089 Other allergic rhinitis: Secondary | ICD-10-CM | POA: Diagnosis not present

## 2022-01-05 DIAGNOSIS — J301 Allergic rhinitis due to pollen: Secondary | ICD-10-CM | POA: Diagnosis not present

## 2022-01-14 DIAGNOSIS — J3089 Other allergic rhinitis: Secondary | ICD-10-CM | POA: Diagnosis not present

## 2022-01-14 DIAGNOSIS — J301 Allergic rhinitis due to pollen: Secondary | ICD-10-CM | POA: Diagnosis not present

## 2022-01-29 DIAGNOSIS — J301 Allergic rhinitis due to pollen: Secondary | ICD-10-CM | POA: Diagnosis not present

## 2022-01-29 DIAGNOSIS — J3089 Other allergic rhinitis: Secondary | ICD-10-CM | POA: Diagnosis not present

## 2022-01-29 DIAGNOSIS — J3081 Allergic rhinitis due to animal (cat) (dog) hair and dander: Secondary | ICD-10-CM | POA: Diagnosis not present

## 2022-02-15 DIAGNOSIS — R438 Other disturbances of smell and taste: Secondary | ICD-10-CM | POA: Diagnosis not present

## 2022-02-16 DIAGNOSIS — J301 Allergic rhinitis due to pollen: Secondary | ICD-10-CM | POA: Diagnosis not present

## 2022-02-16 DIAGNOSIS — J3081 Allergic rhinitis due to animal (cat) (dog) hair and dander: Secondary | ICD-10-CM | POA: Diagnosis not present

## 2022-02-16 DIAGNOSIS — J3089 Other allergic rhinitis: Secondary | ICD-10-CM | POA: Diagnosis not present

## 2022-02-18 DIAGNOSIS — R438 Other disturbances of smell and taste: Secondary | ICD-10-CM | POA: Diagnosis not present

## 2022-03-04 DIAGNOSIS — J3081 Allergic rhinitis due to animal (cat) (dog) hair and dander: Secondary | ICD-10-CM | POA: Diagnosis not present

## 2022-03-04 DIAGNOSIS — J3089 Other allergic rhinitis: Secondary | ICD-10-CM | POA: Diagnosis not present

## 2022-03-04 DIAGNOSIS — J301 Allergic rhinitis due to pollen: Secondary | ICD-10-CM | POA: Diagnosis not present

## 2022-03-16 DIAGNOSIS — J3081 Allergic rhinitis due to animal (cat) (dog) hair and dander: Secondary | ICD-10-CM | POA: Diagnosis not present

## 2022-03-16 DIAGNOSIS — E538 Deficiency of other specified B group vitamins: Secondary | ICD-10-CM | POA: Diagnosis not present

## 2022-03-16 DIAGNOSIS — K219 Gastro-esophageal reflux disease without esophagitis: Secondary | ICD-10-CM | POA: Diagnosis not present

## 2022-03-16 DIAGNOSIS — J301 Allergic rhinitis due to pollen: Secondary | ICD-10-CM | POA: Diagnosis not present

## 2022-03-16 DIAGNOSIS — J3089 Other allergic rhinitis: Secondary | ICD-10-CM | POA: Diagnosis not present

## 2022-03-16 DIAGNOSIS — R438 Other disturbances of smell and taste: Secondary | ICD-10-CM | POA: Diagnosis not present

## 2022-03-16 DIAGNOSIS — Z79899 Other long term (current) drug therapy: Secondary | ICD-10-CM | POA: Diagnosis not present

## 2022-03-19 DIAGNOSIS — Z9889 Other specified postprocedural states: Secondary | ICD-10-CM | POA: Diagnosis not present

## 2022-03-19 DIAGNOSIS — H90A31 Mixed conductive and sensorineural hearing loss, unilateral, right ear with restricted hearing on the contralateral side: Secondary | ICD-10-CM | POA: Diagnosis not present

## 2022-03-19 DIAGNOSIS — H903 Sensorineural hearing loss, bilateral: Secondary | ICD-10-CM | POA: Diagnosis not present

## 2022-03-19 DIAGNOSIS — Z882 Allergy status to sulfonamides status: Secondary | ICD-10-CM | POA: Diagnosis not present

## 2022-03-19 DIAGNOSIS — J301 Allergic rhinitis due to pollen: Secondary | ICD-10-CM | POA: Diagnosis not present

## 2022-03-19 DIAGNOSIS — H9111 Presbycusis, right ear: Secondary | ICD-10-CM | POA: Diagnosis not present

## 2022-03-19 DIAGNOSIS — H6983 Other specified disorders of Eustachian tube, bilateral: Secondary | ICD-10-CM | POA: Diagnosis not present

## 2022-03-19 DIAGNOSIS — Z974 Presence of external hearing-aid: Secondary | ICD-10-CM | POA: Diagnosis not present

## 2022-03-19 DIAGNOSIS — H608X2 Other otitis externa, left ear: Secondary | ICD-10-CM | POA: Diagnosis not present

## 2022-03-26 ENCOUNTER — Other Ambulatory Visit: Payer: Self-pay

## 2022-03-26 ENCOUNTER — Encounter (HOSPITAL_BASED_OUTPATIENT_CLINIC_OR_DEPARTMENT_OTHER): Payer: Self-pay | Admitting: Obstetrics and Gynecology

## 2022-03-26 NOTE — Progress Notes (Signed)
Spoke w/ via phone for pre-op interview---Amy Krause needs dos---- none per anesthesia, surgeon orders pending as of 03/26/22              Krause results------04/08/22 Krause appt for cbc, type & screen COVID test -----patient states asymptomatic no test needed Arrive at -------0530 on Monday, 04/12/22 NPO after MN NO Solid Food.  Clear liquids from MN until---0430 Med rec completed Medications to take morning of surgery -----levothyroxine, omeprazole, raloxifene, sertraline Diabetic medication -----n/a Patient instructed no nail polish to be worn day of surgery Patient instructed to bring photo id and insurance card day of surgery Patient aware to have Driver (ride ) / caregiver    for 24 hours after surgery - husband, Amy Krause, Amy Krause Patient Special Instructions -----Extended / overnight instructions given. Pre-Op special Istructions -----Requested orders via Epic IB from Dr. Helane Rima on 03/26/22. Patient is HOH and wears hearing aids. Patient verbalized understanding of instructions that were given at this phone interview. Patient denies shortness of breath, chest pain, fever, cough at this phone interview.

## 2022-03-26 NOTE — Progress Notes (Signed)
Your procedure is scheduled on Monday, 04/12/22.  Report to [facility].   Call this number if you have problems the morning of surgery  :[phone].   OUR ADDRESS IS [address].  [facility] ARE LOCATED IN [facility].  PLEASE BRING YOUR INSURANCE CARD AND PHOTO ID DAY OF SURGERY.  ONLY 2 PEOPLE ARE ALLOWED IN  WAITING  ROOM.                                      REMEMBER:  DO NOT EAT FOOD, CANDY GUM OR MINTS  AFTER MIDNIGHT THE NIGHT BEFORE YOUR SURGERY . YOU Amy Krause HAVE CLEAR LIQUIDS FROM MIDNIGHT THE NIGHT BEFORE YOUR SURGERY UNTIL  4:30 AM. NO CLEAR LIQUIDS AFTER  4:30 AM DAY OF SURGERY.  YOU Amy Krause  BRUSH YOUR TEETH MORNING OF SURGERY AND RINSE YOUR MOUTH OUT, NO CHEWING GUM CANDY OR MINTS.     CLEAR LIQUID DIET   Foods Allowed                                                                     Foods Excluded  Coffee and tea, regular and decaf                             liquids that you cannot  Plain Jell-O                                                                   see through such as: Fruit ices (not with fruit pulp)                                     milk, soups, orange juice  Plain  Popsicles                                    All solid food Carbonated beverages, regular and diet                                    Cranberry, grape and apple juices Sports drinks like Gatorade _____________________________________________________________________     TAKE ONLY THESE MEDICATIONS MORNING OF SURGERY: Levothyroxine, omeprazole, raloxifene, sertraline    UP TO 4 VISITORS  [place]anae VISIT IN THE EXTENDED RECOVERY ROOM UNTIL 800 PM ONLY.  ONE  VISITOR AGE 21 AND OVER [name] SPEND THE NIGHT AND MUST BE IN EXTENDED RECOVERY ROOM NO LATER THAN 800 PM . YOUR DISCHARGE TIME AFTER YOU SPEND THE NIGHT IS 900 AM THE MORNING AFTER YOUR SURGERY.  YOU Amy Krause PACK A SMALL OVERNIGHT BAG WITH TOILETRIES FOR YOUR  OVERNIGHT STAY IF YOU WISH.  YOUR  PRESCRIPTION MEDICATIONS WILL BE PROVIDED DURING [place].                                      DO NOT WEAR JEWERLY, MAKE UP. DO NOT WEAR LOTIONS, POWDERS, PERFUMES OR NAIL POLISH ON YOUR FINGERNAILS. TOENAIL POLISH IS OK TO WEAR. DO NOT SHAVE FOR 48 HOURS PRIOR TO DAY OF SURGERY. MEN [name] SHAVE FACE AND NECK. CONTACTS, GLASSES, OR DENTURES [name] NOT BE WORN TO SURGERY.  REMEMBER: NO SMOKING, DRUGS OR ALCOHOL FOR 24 HOURS BEFORE YOUR SURGERY.                                    [facility] IS NOT RESPONSIBLE  FOR ANY BELONGINGS.                                                                    Marland Kitchen           [facility] - Preparing for Surgery Before surgery, you can play an important role.  Because skin is not sterile, your skin needs to be as free of germs as possible.  You can reduce the number of germs on your skin by washing with CHG (chlorahexidine gluconate) soap before surgery.  CHG is an antiseptic cleaner which kills germs and bonds with the skin to continue killing germs even after washing. Please DO NOT use if you have an allergy to CHG or antibacterial soaps.  If your skin becomes reddened/irritated stop using the CHG and inform your nurse when you arrive at Short Stay. Do not shave (including legs and underarms) for at least 48 hours prior to the first CHG shower.  You [name] shave your face/neck. Please follow these instructions carefully:  1.  Shower with CHG Soap the night before surgery and the  morning of Surgery.  2.  If you choose to wash your hair, wash your hair first as usual with your  normal  shampoo.  3.  After you shampoo, rinse your hair and body thoroughly to remove the  shampoo.                            4.  Use CHG as you would any other liquid soap.  You can apply chg directly  to the skin and wash , please wash your belly button thoroughly with chg soap provided night before and morning of your surgery.                     Gently with a scrungie or clean  washcloth.  5.  Apply the CHG Soap to your body ONLY FROM THE NECK DOWN.   Do not use on face/ open                           Wound or open sores. Avoid contact with eyes, ears mouth and genitals (private parts).  Wash face,  Genitals (private parts) with your normal soap.             6.  Wash thoroughly, paying special attention to the area where your surgery  will be performed.  7.  Thoroughly rinse your body with warm water from the neck down.  8.  DO NOT shower/wash with your normal soap after using and rinsing off  the CHG Soap.                9.  Pat yourself dry with a clean towel.            10.  Wear clean pajamas.            11.  Place clean sheets on your bed the night of your first shower and do not  sleep with pets. Day of Surgery : Do not apply any lotions/deodorants the morning of surgery.  Please wear clean clothes to the hospital/surgery center.  IF YOU HAVE ANY SKIN IRRITATION OR PROBLEMS WITH THE SURGICAL SOAP, PLEASE GET A BAR OF GOLD DIAL SOAP AND SHOWER THE NIGHT BEFORE YOUR SURGERY AND THE MORNING OF YOUR SURGERY. PLEASE LET THE NURSE KNOW MORNING OF YOUR SURGERY IF YOU HAD ANY PROBLEMS WITH THE SURGICAL SOAP.   ________________________________________________________________________                                                        QUESTIONS [place] [facility] PRE OP NURSE PHONE [phone].

## 2022-03-30 NOTE — H&P (Signed)
  81 year old with symptomatic pelvic relaxation. Feels like something is coming through her vagina. Past Medical History:  Diagnosis Date   Anxiety    Takes Zoloft, follows w/ PCP Dr. Lavone Orn at [facility].   Arthritis    knees   Cancer ([place]) 1991   right breast cancer, s/p chemotherapy and radiation   Dupuytren's contracture    left little finger   GERD (gastroesophageal reflux disease)    Follows with [facility], takes Omeprazole.   History of hiatal hernia    Hypothyroidism    Follows w/ PCP Dr.  Lavone Orn at [facility].   Lupus ([place])    cutaneous, very mild   Multiple allergies    Takes weekly allergy shots as of 03/26/22.   Wears glasses    Wears hearing aid    right ear   Past Surgical History:  Procedure Laterality Date   BREAST LUMPECTOMY Right 1991   COLONOSCOPY     hx of several   DILATION AND CURETTAGE OF UTERUS  2011   MASTOIDECTOMY Bilateral    1991 and 2002   TONSILLECTOMY     as a child   TUBAL LIGATION  1974   TYMPANOMASTOIDECTOMY Right 05/08/2013   Procedure: RIGHT  CANAL-UP TYMPANOMASTOIDECTOMY; MICRODISSECTION USING THE OR MICROSCOPE; PLACEMENT OF PAPARELLA TUBE IN RIGHT EAR ;  Surgeon: Fannie Knee, MD;  Location: [place];  Service: ENT;  Laterality: Right;   TYMPANOSTOMY TUBE PLACEMENT     multiple times per pt   Scheduled Meds: Continuous Infusions: PRN Meds:. Sulfa antibiotics and Tape Social History   Socioeconomic History   Marital status: Married    Spouse name: Not on file   Number of children: Not on file   Years of education: Not on file   Highest education level: Not on file  Occupational History   Not on file  Tobacco Use   Smoking status: Never   Smokeless tobacco: Never  Vaping Use   Vaping Use: Never used  Substance and Sexual Activity   Alcohol use: No   Drug use: Never   Sexual activity: Not on file  Other Topics Concern   Not on file  Social History Narrative   Not  on file   Social Determinants of Health   Financial Resource Strain: Not on file  Food Insecurity: Not on file  Transportation Needs: Not on file  Physical Activity: Not on file  Stress: Not on file  Social Connections: Not on file   History reviewed. No pertinent family history.  General alert and oriented Lung CTAB Car RRR Pelvic  Grade 2 to 3 cystocele Grade 1 to 2 uterine prolapse No rectocele  IMPRESSION: Symptomatic pelvic relaxation  PLAN: TVH BS  Anterior repair  Possible posterior repair Risks reviewed Consent is signed.

## 2022-03-31 DIAGNOSIS — J3081 Allergic rhinitis due to animal (cat) (dog) hair and dander: Secondary | ICD-10-CM | POA: Diagnosis not present

## 2022-03-31 DIAGNOSIS — J301 Allergic rhinitis due to pollen: Secondary | ICD-10-CM | POA: Diagnosis not present

## 2022-03-31 DIAGNOSIS — J3089 Other allergic rhinitis: Secondary | ICD-10-CM | POA: Diagnosis not present

## 2022-04-08 ENCOUNTER — Encounter (HOSPITAL_COMMUNITY)
Admission: RE | Admit: 2022-04-08 | Discharge: 2022-04-08 | Disposition: A | Discharge status: Home / Self Care | Payer: Medicare Other | Source: Ambulatory Visit | Attending: Obstetrics and Gynecology | Admitting: Obstetrics and Gynecology

## 2022-04-08 DIAGNOSIS — Z01812 Encounter for preprocedural laboratory examination: Secondary | ICD-10-CM | POA: Insufficient documentation

## 2022-04-08 DIAGNOSIS — Z01818 Encounter for other preprocedural examination: Secondary | ICD-10-CM

## 2022-04-08 LAB — CBC
HCT: 39.1 % (ref 36.0–46.0)
Hemoglobin: 12.4 g/dL (ref 12.0–15.0)
MCH: 32 pg (ref 26.0–34.0)
MCHC: 31.7 g/dL (ref 30.0–36.0)
MCV: 101 fL — ABNORMAL HIGH (ref 80.0–100.0)
Platelets: 236 10*3/uL (ref 150–400)
RBC: 3.87 MIL/uL (ref 3.87–5.11)
RDW: 14 % (ref 11.5–15.5)
WBC: 7.3 10*3/uL (ref 4.0–10.5)
nRBC: 0 % (ref 0.0–0.2)

## 2022-04-09 DIAGNOSIS — J301 Allergic rhinitis due to pollen: Secondary | ICD-10-CM | POA: Diagnosis not present

## 2022-04-09 DIAGNOSIS — J3089 Other allergic rhinitis: Secondary | ICD-10-CM | POA: Diagnosis not present

## 2022-04-12 ENCOUNTER — Ambulatory Visit (HOSPITAL_BASED_OUTPATIENT_CLINIC_OR_DEPARTMENT_OTHER): Payer: Medicare Other | Admitting: Anesthesiology

## 2022-04-12 ENCOUNTER — Encounter (HOSPITAL_BASED_OUTPATIENT_CLINIC_OR_DEPARTMENT_OTHER): Payer: Self-pay | Admitting: Obstetrics and Gynecology

## 2022-04-12 ENCOUNTER — Encounter (HOSPITAL_BASED_OUTPATIENT_CLINIC_OR_DEPARTMENT_OTHER)
Admission: RE | Disposition: A | Discharge status: Home / Self Care | Payer: Self-pay | Source: Home / Self Care | Attending: Obstetrics and Gynecology

## 2022-04-12 ENCOUNTER — Ambulatory Visit (HOSPITAL_BASED_OUTPATIENT_CLINIC_OR_DEPARTMENT_OTHER)
Admission: RE | Admit: 2022-04-12 | Discharge: 2022-04-13 | Disposition: A | Discharge status: Home / Self Care | Payer: Medicare Other | Attending: Obstetrics and Gynecology | Admitting: Obstetrics and Gynecology

## 2022-04-12 DIAGNOSIS — E669 Obesity, unspecified: Secondary | ICD-10-CM | POA: Insufficient documentation

## 2022-04-12 DIAGNOSIS — M1990 Unspecified osteoarthritis, unspecified site: Secondary | ICD-10-CM | POA: Diagnosis not present

## 2022-04-12 DIAGNOSIS — N8189 Other female genital prolapse: Secondary | ICD-10-CM | POA: Diagnosis not present

## 2022-04-12 DIAGNOSIS — E039 Hypothyroidism, unspecified: Secondary | ICD-10-CM | POA: Diagnosis not present

## 2022-04-12 DIAGNOSIS — F419 Anxiety disorder, unspecified: Secondary | ICD-10-CM | POA: Diagnosis not present

## 2022-04-12 DIAGNOSIS — N814 Uterovaginal prolapse, unspecified: Secondary | ICD-10-CM | POA: Insufficient documentation

## 2022-04-12 DIAGNOSIS — K219 Gastro-esophageal reflux disease without esophagitis: Secondary | ICD-10-CM | POA: Insufficient documentation

## 2022-04-12 DIAGNOSIS — N816 Rectocele: Secondary | ICD-10-CM | POA: Insufficient documentation

## 2022-04-12 DIAGNOSIS — N8003 Adenomyosis of the uterus: Secondary | ICD-10-CM | POA: Diagnosis not present

## 2022-04-12 DIAGNOSIS — Z6831 Body mass index (BMI) 31.0-31.9, adult: Secondary | ICD-10-CM | POA: Insufficient documentation

## 2022-04-12 DIAGNOSIS — Z01818 Encounter for other preprocedural examination: Secondary | ICD-10-CM

## 2022-04-12 DIAGNOSIS — N815 Vaginal enterocele: Secondary | ICD-10-CM | POA: Diagnosis not present

## 2022-04-12 HISTORY — PX: RECTOCELE REPAIR: SHX761

## 2022-04-12 HISTORY — PX: CYSTOCELE REPAIR: SHX163

## 2022-04-12 HISTORY — DX: Personal history of other diseases of the digestive system: Z87.19

## 2022-04-12 HISTORY — DX: Hypothyroidism, unspecified: E03.9

## 2022-04-12 HISTORY — PX: VAGINAL HYSTERECTOMY: SHX2639

## 2022-04-12 HISTORY — DX: Gastro-esophageal reflux disease without esophagitis: K21.9

## 2022-04-12 HISTORY — DX: Unspecified osteoarthritis, unspecified site: M19.90

## 2022-04-12 HISTORY — DX: Anxiety disorder, unspecified: F41.9

## 2022-04-12 HISTORY — DX: Palmar fascial fibromatosis (dupuytren): M72.0

## 2022-04-12 LAB — TYPE AND SCREEN
ABO/RH(D): O NEG
Antibody Screen: NEGATIVE

## 2022-04-12 LAB — ABO/RH: ABO/RH(D): O NEG

## 2022-04-12 SURGERY — HYSTERECTOMY, VAGINAL
Anesthesia: General | Site: Vagina

## 2022-04-12 MED ORDER — SUGAMMADEX SODIUM 200 MG/2ML IV SOLN
INTRAVENOUS | Status: DC | PRN
  Administered 2022-04-12: 100 mg via INTRAVENOUS

## 2022-04-12 MED ORDER — POVIDONE-IODINE 10 % EX SWAB: 2.0000 | Freq: Once | CUTANEOUS | Status: DC

## 2022-04-12 MED ORDER — EPHEDRINE 5 MG/ML INJ
INTRAVENOUS | Status: AC
  Filled 2022-04-12: qty 5

## 2022-04-12 MED ORDER — FENTANYL CITRATE (PF) 100 MCG/2ML IJ SOLN
INTRAMUSCULAR | Status: DC | PRN
  Administered 2022-04-12: 100 ug via INTRAVENOUS
  Administered 2022-04-12 (×2): 50 ug via INTRAVENOUS

## 2022-04-12 MED ORDER — HYDROMORPHONE HCL 1 MG/ML IJ SOLN
0.2500 mg | INTRAMUSCULAR | Status: DC | PRN
  Administered 2022-04-12: 0.25 mg via INTRAVENOUS
  Administered 2022-04-12: 0.5 mg via INTRAVENOUS
  Administered 2022-04-12: 0.25 mg via INTRAVENOUS
  Administered 2022-04-12 (×2): 0.5 mg via INTRAVENOUS

## 2022-04-12 MED ORDER — SODIUM CHLORIDE 0.9 % IV SOLN
INTRAVENOUS | Status: AC
  Filled 2022-04-12: qty 2

## 2022-04-12 MED ORDER — ACETAMINOPHEN 325 MG PO TABS: 650.0000 mg | ORAL_TABLET | ORAL | Status: DC | PRN

## 2022-04-12 MED ORDER — DOCUSATE SODIUM 100 MG PO CAPS: 100.0000 mg | ORAL_CAPSULE | Freq: Two times a day (BID) | ORAL | Status: DC

## 2022-04-12 MED ORDER — PANTOPRAZOLE SODIUM 40 MG PO TBEC
DELAYED_RELEASE_TABLET | ORAL | Status: AC
  Filled 2022-04-12: qty 1

## 2022-04-12 MED ORDER — ONDANSETRON HCL 4 MG/2ML IJ SOLN
INTRAMUSCULAR | Status: DC | PRN
  Administered 2022-04-12: 4 mg via INTRAVENOUS

## 2022-04-12 MED ORDER — LEVOTHYROXINE SODIUM 75 MCG PO TABS
75.0000 ug | ORAL_TABLET | Freq: Every day | ORAL | Status: DC
  Administered 2022-04-13: 75 ug via ORAL
  Filled 2022-04-12: qty 1

## 2022-04-12 MED ORDER — SERTRALINE HCL 50 MG PO TABS
50.0000 mg | ORAL_TABLET | Freq: Every day | ORAL | Status: DC
  Filled 2022-04-12: qty 1

## 2022-04-12 MED ORDER — MONTELUKAST SODIUM 10 MG PO TABS
10.0000 mg | ORAL_TABLET | Freq: Every day | ORAL | Status: DC
  Filled 2022-04-12: qty 1

## 2022-04-12 MED ORDER — 0.9 % SODIUM CHLORIDE (POUR BTL) OPTIME
TOPICAL | Status: DC | PRN
  Administered 2022-04-12: 500 mL

## 2022-04-12 MED ORDER — OXYCODONE HCL 5 MG/5ML PO SOLN: 5.0000 mg | Freq: Once | ORAL | Status: DC | PRN

## 2022-04-12 MED ORDER — HYDROMORPHONE HCL 1 MG/ML IJ SOLN
INTRAMUSCULAR | Status: AC
  Filled 2022-04-12: qty 1

## 2022-04-12 MED ORDER — LACTATED RINGERS IV SOLN
INTRAVENOUS | Status: DC
Start: 2022-04-12 — End: 2022-04-12

## 2022-04-12 MED ORDER — MENTHOL 3 MG MT LOZG: 1.0000 | LOZENGE | OROMUCOSAL | Status: DC | PRN

## 2022-04-12 MED ORDER — LACTATED RINGERS IV SOLN: INTRAVENOUS | Status: DC

## 2022-04-12 MED ORDER — PROPOFOL 10 MG/ML IV BOLUS
INTRAVENOUS | Status: AC
  Filled 2022-04-12: qty 20

## 2022-04-12 MED ORDER — LIDOCAINE HCL (PF) 2 % IJ SOLN
INTRAMUSCULAR | Status: AC
  Filled 2022-04-12: qty 5

## 2022-04-12 MED ORDER — AMISULPRIDE (ANTIEMETIC) 5 MG/2ML IV SOLN
10.0000 mg | Freq: Once | INTRAVENOUS | Status: AC | PRN
  Administered 2022-04-12: 10 mg via INTRAVENOUS

## 2022-04-12 MED ORDER — ALUM & MAG HYDROXIDE-SIMETH 200-200-20 MG/5ML PO SUSP: 30.0000 mL | ORAL | Status: DC | PRN

## 2022-04-12 MED ORDER — LIDOCAINE HCL 1 % IJ SOLN: INTRAMUSCULAR | Status: DC | PRN

## 2022-04-12 MED ORDER — KETOROLAC TROMETHAMINE 30 MG/ML IJ SOLN
INTRAMUSCULAR | Status: DC | PRN
  Administered 2022-04-12: 15 mg via INTRAVENOUS

## 2022-04-12 MED ORDER — AMISULPRIDE (ANTIEMETIC) 5 MG/2ML IV SOLN
INTRAVENOUS | Status: AC
  Filled 2022-04-12: qty 4

## 2022-04-12 MED ORDER — RALOXIFENE HCL 60 MG PO TABS
60.0000 mg | ORAL_TABLET | Freq: Every evening | ORAL | Status: DC
  Administered 2022-04-12: 60 mg via ORAL
  Filled 2022-04-12: qty 1

## 2022-04-12 MED ORDER — OXYCODONE HCL 5 MG PO TABS: 5.0000 mg | ORAL_TABLET | ORAL | Status: DC | PRN

## 2022-04-12 MED ORDER — ACETAMINOPHEN 10 MG/ML IV SOLN
INTRAVENOUS | Status: AC
  Filled 2022-04-12: qty 100

## 2022-04-12 MED ORDER — LACTATED RINGERS IV SOLN
INTRAVENOUS | Status: DC
  Administered 2022-04-12: 1 mL via INTRAVENOUS

## 2022-04-12 MED ORDER — ROCURONIUM BROMIDE 10 MG/ML (PF) SYRINGE
PREFILLED_SYRINGE | INTRAVENOUS | Status: AC
  Filled 2022-04-12: qty 10

## 2022-04-12 MED ORDER — EPHEDRINE SULFATE-NACL 50-0.9 MG/10ML-% IV SOSY
PREFILLED_SYRINGE | INTRAVENOUS | Status: DC | PRN
  Administered 2022-04-12: 5 mg via INTRAVENOUS

## 2022-04-12 MED ORDER — HYDROMORPHONE HCL 1 MG/ML IJ SOLN: 0.2000 mg | INTRAMUSCULAR | Status: DC | PRN

## 2022-04-12 MED ORDER — ARTIFICIAL TEARS OPHTHALMIC OINT
TOPICAL_OINTMENT | OPHTHALMIC | Status: AC
  Filled 2022-04-12: qty 3.5

## 2022-04-12 MED ORDER — KETOROLAC TROMETHAMINE 30 MG/ML IJ SOLN
INTRAMUSCULAR | Status: AC
  Filled 2022-04-12: qty 1

## 2022-04-12 MED ORDER — PROPOFOL 10 MG/ML IV BOLUS
INTRAVENOUS | Status: DC | PRN
  Administered 2022-04-12: 120 mg via INTRAVENOUS
  Administered 2022-04-12: 30 mg via INTRAVENOUS

## 2022-04-12 MED ORDER — POLYETHYLENE GLYCOL 3350 17 G PO PACK: 17.0000 g | PACK | Freq: Every day | ORAL | Status: DC | PRN

## 2022-04-12 MED ORDER — DEXAMETHASONE SODIUM PHOSPHATE 10 MG/ML IJ SOLN
INTRAMUSCULAR | Status: AC
  Filled 2022-04-12: qty 1

## 2022-04-12 MED ORDER — ROCURONIUM BROMIDE 10 MG/ML (PF) SYRINGE
PREFILLED_SYRINGE | INTRAVENOUS | Status: DC | PRN
  Administered 2022-04-12: 70 mg via INTRAVENOUS

## 2022-04-12 MED ORDER — PANTOPRAZOLE SODIUM 40 MG PO TBEC
40.0000 mg | DELAYED_RELEASE_TABLET | Freq: Every day | ORAL | Status: DC
  Administered 2022-04-12: 40 mg via ORAL

## 2022-04-12 MED ORDER — PROMETHAZINE HCL 25 MG/ML IJ SOLN: 6.2500 mg | INTRAMUSCULAR | Status: DC | PRN

## 2022-04-12 MED ORDER — FENTANYL CITRATE (PF) 250 MCG/5ML IJ SOLN
INTRAMUSCULAR | Status: AC
  Filled 2022-04-12: qty 5

## 2022-04-12 MED ORDER — ESTRADIOL 0.1 MG/GM VA CREA
TOPICAL_CREAM | VAGINAL | Status: DC | PRN
  Administered 2022-04-12: 1 via VAGINAL

## 2022-04-12 MED ORDER — ONDANSETRON HCL 4 MG/2ML IJ SOLN
INTRAMUSCULAR | Status: AC
  Filled 2022-04-12: qty 2

## 2022-04-12 MED ORDER — LIDOCAINE 2% (20 MG/ML) 5 ML SYRINGE
INTRAMUSCULAR | Status: DC | PRN
  Administered 2022-04-12: 40 mg via INTRAVENOUS

## 2022-04-12 MED ORDER — HYDROMORPHONE HCL 1 MG/ML IJ SOLN
0.5000 mg | INTRAMUSCULAR | Status: DC | PRN
  Administered 2022-04-12: 0.5 mg via INTRAVENOUS

## 2022-04-12 MED ORDER — IBUPROFEN 200 MG PO TABS
600.0000 mg | ORAL_TABLET | Freq: Four times a day (QID) | ORAL | Status: DC
  Administered 2022-04-12 – 2022-04-13 (×2): 600 mg via ORAL

## 2022-04-12 MED ORDER — ACETAMINOPHEN 10 MG/ML IV SOLN
1000.0000 mg | Freq: Once | INTRAVENOUS | Status: AC
  Administered 2022-04-12: 1000 mg via INTRAVENOUS

## 2022-04-12 MED ORDER — OXYCODONE HCL 5 MG PO TABS: 5.0000 mg | ORAL_TABLET | Freq: Once | ORAL | Status: DC | PRN

## 2022-04-12 MED ORDER — MENTHOL 3 MG MT LOZG
LOZENGE | OROMUCOSAL | Status: AC
  Filled 2022-04-12: qty 9

## 2022-04-12 MED ORDER — SODIUM CHLORIDE 0.9 % IV SOLN
2.0000 g | INTRAVENOUS | Status: AC
  Administered 2022-04-12: 2 g via INTRAVENOUS

## 2022-04-12 MED ORDER — DEXAMETHASONE SODIUM PHOSPHATE 10 MG/ML IJ SOLN
INTRAMUSCULAR | Status: DC | PRN
  Administered 2022-04-12: 10 mg via INTRAVENOUS

## 2022-04-12 MED ORDER — IBUPROFEN 200 MG PO TABS
ORAL_TABLET | ORAL | Status: AC
  Filled 2022-04-12: qty 3

## 2022-04-12 SURGICAL SUPPLY — 28 items
BLADE SURG 15 STRL LF DISP TIS (BLADE) ×2 IMPLANT
BLADE SURG 15 STRL SS (BLADE) ×2
DRAPE SHEET LG 3/4 BI-LAMINATE (DRAPES) ×2 IMPLANT
GAUZE 4X4 16PLY ~~LOC~~+RFID DBL (SPONGE) ×4 IMPLANT
GAUZE PACKING 2X5 YD STERILE (GAUZE/BANDAGES/DRESSINGS) IMPLANT
GLOVE BIO SURGEON STRL SZ 6.5 (GLOVE) ×6 IMPLANT
GLOVE BIO SURGEON STRL SZ7 (GLOVE) ×2 IMPLANT
GLOVE BIOGEL PI IND STRL 7.0 (GLOVE) IMPLANT
GLOVE ECLIPSE 6.5 STRL STRAW (GLOVE) IMPLANT
GLOVE INDICATOR 6.5 STRL GRN (GLOVE) ×2 IMPLANT
GLOVE SURG SS PI 7.5 STRL IVOR (GLOVE) IMPLANT
GOWN STRL REUS W/TWL LRG LVL3 (GOWN DISPOSABLE) ×2 IMPLANT
HOLDER FOLEY CATH W/STRAP (MISCELLANEOUS) ×2 IMPLANT
KIT TURNOVER CYSTO (KITS) ×2 IMPLANT
MANIFOLD NEPTUNE II (INSTRUMENTS) IMPLANT
NDL HYPO 25X1 1.5 SAFETY (NEEDLE) ×2 IMPLANT
NEEDLE HYPO 25X1 1.5 SAFETY (NEEDLE) ×2 IMPLANT
NS IRRIG 500ML POUR BTL (IV SOLUTION) ×2 IMPLANT
PACK VAGINAL WOMENS (CUSTOM PROCEDURE TRAY) ×4 IMPLANT
PAD OB MATERNITY 4.3X12.25 (PERSONAL CARE ITEMS) ×2 IMPLANT
SURGILUBE 2OZ TUBE FLIPTOP (MISCELLANEOUS) ×2 IMPLANT
SUT VIC AB 0 CT1 18XCR BRD8 (SUTURE) ×4 IMPLANT
SUT VIC AB 0 CT1 36 (SUTURE) ×6 IMPLANT
SUT VIC AB 0 CT1 8-18 (SUTURE) ×4
SUT VIC AB 2-0 UR5 27 (SUTURE) ×2 IMPLANT
SUT VICRYL 0 TIES 12 18 (SUTURE) ×2 IMPLANT
TOWEL OR 17X26 10 PK STRL BLUE (TOWEL DISPOSABLE) ×4 IMPLANT
TRAY FOLEY W/BAG SLVR 14FR LF (SET/KITS/TRAYS/PACK) ×2 IMPLANT

## 2022-04-12 NOTE — Anesthesia Procedure Notes (Signed)
Procedure Name: Intubation Date/Time: 04/12/2022 7:43 AM  Performed by: Rogers Blocker, CRNAPre-anesthesia Checklist: Patient identified, Emergency Drugs available, Suction available and Patient being monitored Patient Re-evaluated:Patient Re-evaluated prior to induction Oxygen Delivery Method: Circle System Utilized Preoxygenation: Pre-oxygenation with 100% oxygen Induction Type: IV induction Ventilation: Mask ventilation without difficulty Laryngoscope Size: Mac and 3 Grade View: Grade I Tube type: Oral Tube size: 7.0 mm Number of attempts: 1 Airway Equipment and Method: Stylet and Bite block Placement Confirmation: ETT inserted through vocal cords under direct vision, positive ETCO2 and breath sounds checked- equal and bilateral Secured at: 22 cm Tube secured with: Tape Dental Injury: Teeth and Oropharynx as per pre-operative assessment

## 2022-04-12 NOTE — Anesthesia Postprocedure Evaluation (Signed)
Anesthesia Post Note  Patient: Amy Krause  Procedure(s) Performed: TOTAL VAGINAL HYSTERECTOMY (Bilateral: Vagina ) ANTERIOR REPAIR (CYSTOCELE) (Vagina ) POSTERIOR REPAIR (RECTOCELE) (Vagina )     Patient location during evaluation: PACU Anesthesia Type: General Level of consciousness: awake and alert Pain management: pain level controlled Vital Signs Assessment: post-procedure vital signs reviewed and stable Respiratory status: spontaneous breathing, nonlabored ventilation and respiratory function stable Cardiovascular status: blood pressure returned to baseline and stable Postop Assessment: no apparent nausea or vomiting Anesthetic complications: no   No notable events documented.  Last Vitals:  Vitals:   04/12/22 1015 04/12/22 1030  BP: 123/71 131/67  Pulse: 65 71  Resp: 18 (!) 22  Temp:  (!) 36.4 C  SpO2: (!) 82% 94%    Last Pain:  Vitals:   04/12/22 1000  TempSrc:   PainSc: Asleep                 Lynda Rainwater

## 2022-04-12 NOTE — Progress Notes (Signed)
H and P on the chart  No significant changes Will proceed with TVH, Anterior repair and possible posterior repair  And BS Risks reviewed Consent signed  BP (!) 153/94   Pulse 71   Temp 98.1 F (36.7 C) (Oral)   Resp 16   Ht '4\' 10"'$  (1.473 m)   Wt 68.2 kg   SpO2 99%   BMI 31.41 kg/m  Results for orders placed or performed during the hospital encounter of 04/12/22 (from the past 24 hour(s))  ABO/Rh     Status: None (Preliminary result)   Collection Time: 04/12/22  6:19 AM  Result Value Ref Range   ABO/RH(D) PENDING    Scheduled Meds:  povidone-iodine  2 Application Topical Once   Continuous Infusions:  cefoTEtan (CEFOTAN) IV     lactated ringers 50 mL/hr at 04/12/22 0629   lactated ringers     PRN Meds:. Prior to Admission medications   Medication Sig Start Date End Date Taking? Authorizing Provider  calcium-vitamin D (OSCAL WITH D) 500-200 MG-UNIT per tablet Take 1 tablet by mouth daily with breakfast.   Yes [provider]  Cholecalciferol (VITAMIN D3) 25 MCG (1000 UT) CAPS Take by mouth. 3 tablets daily   Yes [provider]  levothyroxine (SYNTHROID, LEVOTHROID) 75 MCG tablet Take 75 mcg by mouth daily before breakfast.   Yes [provider]  montelukast (SINGULAIR) 10 MG tablet Take 10 mg by mouth at bedtime.   Yes [provider]  Multiple Vitamins-Minerals (MULTIVITAMIN WITH MINERALS) tablet Take 1 tablet by mouth daily.   Yes [provider]  omeprazole (PRILOSEC) 40 MG capsule Take 40 mg by mouth daily.   Yes [provider]  Propylene Glycol (SYSTANE COMPLETE OP) Apply to eye as needed.   Yes [provider]  raloxifene (EVISTA) 60 MG tablet Take 60 mg by mouth every evening.   Yes [provider]  sertraline (ZOLOFT) 50 MG tablet Take 50 mg by mouth daily.   Yes [provider]  clobetasol cream (TEMOVATE) 0.05 % Apply topically as needed. 12/19/19   [provider]  EPINEPHrine  0.3 mg/0.3 mL IJ SOAJ injection INJECT INTRAMUSCULARLY AS DIRECTED 12/31/19   [provider]  polyethylene glycol powder (GLYCOLAX/MIRALAX) 17 GM/SCOOP powder Take by mouth as needed.    [provider]

## 2022-04-12 NOTE — Anesthesia Preprocedure Evaluation (Signed)
Anesthesia Evaluation  Patient identified by MRN, date of birth, ID band Patient awake    Reviewed: Allergy & Precautions, H&P , NPO status , Patient's Chart, lab work & pertinent test results  History of Anesthesia Complications Negative for: history of anesthetic complications  Airway Mallampati: I  TM Distance: >3 FB Neck ROM: Full    Dental no notable dental hx.    Pulmonary neg pulmonary ROS,    Pulmonary exam normal breath sounds clear to auscultation       Cardiovascular negative cardio ROS Normal cardiovascular exam Rhythm:Regular Rate:Normal     Neuro/Psych Anxiety negative neurological ROS  negative psych ROS   GI/Hepatic GERD  ,  Endo/Other  Hypothyroidism   Renal/GU      Musculoskeletal  (+) Arthritis , Osteoarthritis,    Abdominal (+) + obese,   Peds  Hematology   Anesthesia Other Findings   Reproductive/Obstetrics                             Anesthesia Physical  Anesthesia Plan  ASA: II  Anesthesia Plan: General   Post-op Pain Management: Dilaudid IV, Ofirmev IV (intra-op)* and Toradol IV (intra-op)*   Induction: Intravenous  PONV Risk Score and Plan: 3 and Ondansetron, Dexamethasone, Midazolam and Treatment [place] vary due to age or medical condition  Airway Management Planned: Oral ETT  Additional Equipment:   Intra-op Plan:   Post-operative Plan: Extubation in OR  Informed Consent: I have reviewed the patients History and Physical, chart, labs and discussed the procedure including the risks, benefits and alternatives for the proposed anesthesia with the patient or authorized representative who has indicated his/her understanding and acceptance.       Plan Discussed with: CRNA and Surgeon  Anesthesia Plan Comments:         Anesthesia Quick Evaluation

## 2022-04-12 NOTE — Brief Op Note (Signed)
04/12/2022  8:59 AM  PATIENT:  Amy Krause  80 y.o. female  PRE-OPERATIVE DIAGNOSIS: Pelvic relaxation  POST-OPERATIVE DIAGNOSIS:  Same  PROCEDURE:  Procedure(s): TOTAL VAGINAL HYSTERECTOMY (Bilateral) ANTERIOR REPAIR (CYSTOCELE) (N/A) POSTERIOR REPAIR (RECTOCELE) (N/A) McCall Cul de sac culdoplasty  SURGEON:  Surgeon(s) and Role:    * Dian Queen, MD - Primary    * McComb, John, MD - Assisting  PHYSICIAN ASSISTANT:   ASSISTANTS: none   ANESTHESIA:   general  EBL:  50 mL   BLOOD ADMINISTERED:none  DRAINS: Urinary Catheter (Foley) and vaginal packing    LOCAL MEDICATIONS USED:  NONE  SPECIMEN:  Source of Specimen:  cervix and uterus  DISPOSITION OF SPECIMEN:  PATHOLOGY  COUNTS:  YES  TOURNIQUET:  * No tourniquets in log *  DICTATION: .Other Dictation: Dictation Number dictated  PLAN OF CARE: Admit for overnight observation  PATIENT DISPOSITION:  PACU - hemodynamically stable.   Delay start of Pharmacological VTE agent (>24hrs) due to surgical blood loss or risk of bleeding: not applicable

## 2022-04-12 NOTE — Progress Notes (Signed)
Patient very sleepy. Was in pain but now now. BP 109/64 (BP Location: Left Arm)   Pulse 84   Temp (!) 97.4 F (36.3 C)   Resp 16   Ht '4\' 10"'$  (1.473 m)   Wt 68.2 kg   SpO2 99%   BMI 31.41 kg/m  Results for orders placed or performed during the hospital encounter of 04/12/22 (from the past 24 hour(s))  ABO/Rh     Status: None   Collection Time: 04/12/22  6:19 AM  Result Value Ref Range   ABO/RH(D)      Jenetta Downer NEG Performed at [facility] [address] [place], [place] [address]    Urine output clear and good Vaginal packing removed and no active bleeding noted  Post op doing well Remove foley tomorrow at 0500 Routine care Surgical findings reviewed with patient and her family

## 2022-04-12 NOTE — Transfer of Care (Signed)
Immediate Anesthesia Transfer of Care Note  Patient: Amy Krause  Procedure(s) Performed: TOTAL VAGINAL HYSTERECTOMY (Bilateral: Vagina ) ANTERIOR REPAIR (CYSTOCELE) (Vagina ) POSTERIOR REPAIR (RECTOCELE) (Vagina )  Patient Location: PACU  Anesthesia Type:General  Level of Consciousness: awake, alert , oriented and patient cooperative  Airway & Oxygen Therapy: Patient Spontanous Breathing  Post-op Assessment: Report given to RN and Post -op Vital signs reviewed and stable  Post vital signs: Reviewed and stable  Last Vitals:  Vitals Value Taken Time  BP 133/81 04/12/22 0905  Temp    Pulse 71 04/12/22 0908  Resp 23 04/12/22 0908  SpO2 96 % 04/12/22 0908  Vitals shown include unvalidated device data.  Last Pain:  Vitals:   04/12/22 0606  TempSrc: Oral  PainSc: 0-No pain      Patients Stated Pain Goal: 5 (87/86/76 7209)  Complications: No notable events documented.

## 2022-04-13 ENCOUNTER — Encounter (HOSPITAL_BASED_OUTPATIENT_CLINIC_OR_DEPARTMENT_OTHER): Payer: Self-pay | Admitting: Obstetrics and Gynecology

## 2022-04-13 DIAGNOSIS — E039 Hypothyroidism, unspecified: Secondary | ICD-10-CM | POA: Diagnosis not present

## 2022-04-13 DIAGNOSIS — N8003 Adenomyosis of the uterus: Secondary | ICD-10-CM | POA: Diagnosis not present

## 2022-04-13 DIAGNOSIS — M1990 Unspecified osteoarthritis, unspecified site: Secondary | ICD-10-CM | POA: Diagnosis not present

## 2022-04-13 DIAGNOSIS — N814 Uterovaginal prolapse, unspecified: Secondary | ICD-10-CM | POA: Diagnosis not present

## 2022-04-13 DIAGNOSIS — N816 Rectocele: Secondary | ICD-10-CM | POA: Diagnosis not present

## 2022-04-13 DIAGNOSIS — K219 Gastro-esophageal reflux disease without esophagitis: Secondary | ICD-10-CM | POA: Diagnosis not present

## 2022-04-13 LAB — CBC
HCT: 26.8 % — ABNORMAL LOW (ref 36.0–46.0)
Hemoglobin: 8.5 g/dL — ABNORMAL LOW (ref 12.0–15.0)
MCH: 32.6 pg (ref 26.0–34.0)
MCHC: 31.7 g/dL (ref 30.0–36.0)
MCV: 102.7 fL — ABNORMAL HIGH (ref 80.0–100.0)
Platelets: 155 10*3/uL (ref 150–400)
RBC: 2.61 MIL/uL — ABNORMAL LOW (ref 3.87–5.11)
RDW: 13.8 % (ref 11.5–15.5)
WBC: 9 10*3/uL (ref 4.0–10.5)
nRBC: 0 % (ref 0.0–0.2)

## 2022-04-13 MED ORDER — IBUPROFEN 200 MG PO TABS
ORAL_TABLET | ORAL | Status: AC
  Filled 2022-04-13: qty 3

## 2022-04-13 NOTE — Discharge Summary (Signed)
Admission Diagnosis: Pelvic relaxation   Discharge Diagnosis: Same Anemia related to surgery  Hospital Course: 81 year old female admitted for TVH, AP Repair and McCall Cul de sac culdoplasty. Did very well post op. By POD # 1 morning, she had NO PAIN and FOLEY and Vaginal Packing had already been removed. She was tolerating a regular diet.  BP 110/64 (BP Location: Left Arm)   Pulse 67   Temp 98.4 F (36.9 C) (Oral)   Resp 16   Ht '4\' 10"'$  (1.473 m)   Wt 68.2 kg   SpO2 95%   BMI 31.41 kg/m  Results for orders placed or performed during the hospital encounter of 04/12/22 (from the past 24 hour(s))  CBC     Status: Abnormal   Collection Time: 04/13/22  4:43 AM  Result Value Ref Range   WBC 9.0 4.0 - 10.5 K/uL   RBC 2.61 (L) 3.87 - 5.11 MIL/uL   Hemoglobin 8.5 (L) 12.0 - 15.0 g/dL   HCT 26.8 (L) 36.0 - 46.0 %   MCV 102.7 (H) 80.0 - 100.0 fL   MCH 32.6 26.0 - 34.0 pg   MCHC 31.7 30.0 - 36.0 g/dL   RDW 13.8 11.5 - 15.5 %   Platelets 155 150 - 400 K/uL   nRBC 0.0 0.0 - 0.2 %   Abdomen is soft and non tender No vaginal bleeding noted  Patient was discharged home in good condition. Sitz baths bid Start Colace twice daily Follow up in office in one week Tylenol prn pain

## 2022-04-14 LAB — SURGICAL PATHOLOGY

## 2022-04-15 NOTE — Op Note (Signed)
NAMEARIELY, RIDDELL MEDICAL RECORD NO: [id number] ACCOUNT NO: [id number] DATE OF BIRTH: 09/08/1940 FACILITY: [facility] LOCATION: [facility] PHYSICIAN: Dorinne Graeff L. Helane Rima, MD  Operative Report   DATE OF PROCEDURE: 04/12/2022  PREOPERATIVE DIAGNOSIS:  Symptomatic pelvic relaxation.   POSTOPERATIVE DIAGNOSIS:   Symptomatic pelvic relaxation.  PROCEDURE:  Total vaginal hysterectomy, anterior colporrhaphy, posterior colporrhaphy with perineoplasty and McCall cul-de-sac culdoplasty.  SURGEON:  Sunita Demond L. Helane Rima, MD.  ASSISTANT:  Dr. Arvella Nigh.  ANESTHESIA:  General.  ESTIMATED BLOOD LOSS:  50 mL  DRAINS:  Foley catheter and vaginal packing.  COMPLICATIONS:  None.  PATHOLOGY:  Uterus and cervix sent to pathology.  DESCRIPTION OF PROCEDURE:  The patient was taken to the operating room after she was thoroughly consented about the risk associated with the procedure.  She is taken to the operating room and intubated.  She was then prepped and draped in the usual  sterile fashion and a Foley catheter is inserted and draining clear urine.  Exam under anesthesia reveals a grade II to III cystocele, grade II to III cervical uterine prolapse and a grade I to II rectocele.  A weighted speculum was placed in the vagina  and the cervix was grasped with a tenaculum and a circumferential incision was made around the cervix.  The posterior cul-de-sac was entered sharply using Metzenbaum scissors and the anterior cul-de-sac was entered sharply using Metzenbaum scissors, we  then used the curved Heaney clamps to clamp the uterosacral ligaments on either side.  Each pedicle was clamped, cut and suture ligated using 0 Vicryl suture.  Walked our way up to the cervix and cervix was elongated most likely due to the long time she  has had prolapse.  Each pedicle was clamped, cut and suture ligated using 0 Vicryl suture.  We then reached the triple pedicle and fundus, we placed the Heaney clamp across each  triple pedicle.  Each pedicle was secured using both the suture ligature and  a free tie of 0 Vicryl suture.  The specimen was removed and identified as cervix and uterus and sent to pathology.  Hemostasis was very good.  We attempted to retrieve the tubes, but they were very atrophic and along with the ovaries and high against  the sidewall and decided not to try to retrieve them because of issues with trying to reach them vaginally, so we then placed an angle stitch at 3 and 9 o'clock at the vaginal cuff, closed the posterior cuff in a running stitch using 0 Vicryl suture and  then I placed one stitch with a McCall culdoplasty using 0 Vicryl suture.  At this point, hemostasis was very good and we decided to proceed with an anterior colporrhaphy.  We grasped the vaginal cuff anteriorly at the apex on either side with Allis  clamps, made a midline incision all the way in the midline to the urethrovesical angle.  We then reduced the cystocele with sharp and blunt dissection.  We closed and reduced the cystocele with a pursestring suture in standard fashion using 2-0 Vicryl  suture.  We then trimmed off the redundant vaginal epithelium and then closed the vaginal epithelium using 0 Vicryl in a running locked stitch.  We then tied down the cul-de-sac stitch and then closed the remainder of the cuff with 0 Vicryl suture.   Hemostasis was very good at this point and inspection revealed she had good support of the vaginal apex reduction of the cystocele.  Inspection posteriorly notes that there was still  some weakness of the posterior vaginal wall and at this point, it was  decided to proceed with perineoplasty and posterior colporrhaphy.  Allis clamps were placed at 5 and 7 o'clock at the posterior fourchette.  A V-shaped incision was made in standard fashion at the perineum and we made a midline incision approximately 4  cm of the vaginal wall.  We reduced the rectocele using sharp and blunt dissection and then  placed a series of interrupted by reapproximating the rectovaginal fascia in the midline trimming off the redundant vaginal epithelium and then closing using 2-0  Vicryl suture.  We then used 0 Vicryl stitches to reapproximate the perineum with a perineoplasty and closed the remainder in a subcuticular stitch in a standard fashion.  At the end of the procedure she had very good support throughout.  Vaginal packing  with estrace cream was inserted.  Her urine output was good and it was clear, she was then extubated and went to recovery room in stable condition.  Prior to that all sponge, lap and instrument counts were correct x2.     Elián.Darby D: 04/15/2022 7:09:10 am T: 04/15/2022 7:41:00 am  JOB: [id number]/ [id number]

## 2022-04-21 DIAGNOSIS — J3089 Other allergic rhinitis: Secondary | ICD-10-CM | POA: Diagnosis not present

## 2022-04-21 DIAGNOSIS — J301 Allergic rhinitis due to pollen: Secondary | ICD-10-CM | POA: Diagnosis not present

## 2022-05-11 DIAGNOSIS — Z23 Encounter for immunization: Secondary | ICD-10-CM | POA: Diagnosis not present

## 2022-05-12 DIAGNOSIS — J3089 Other allergic rhinitis: Secondary | ICD-10-CM | POA: Diagnosis not present

## 2022-05-12 DIAGNOSIS — J301 Allergic rhinitis due to pollen: Secondary | ICD-10-CM | POA: Diagnosis not present

## 2022-05-12 DIAGNOSIS — J3081 Allergic rhinitis due to animal (cat) (dog) hair and dander: Secondary | ICD-10-CM | POA: Diagnosis not present

## 2022-05-24 DIAGNOSIS — J301 Allergic rhinitis due to pollen: Secondary | ICD-10-CM | POA: Diagnosis not present

## 2022-05-24 DIAGNOSIS — J3081 Allergic rhinitis due to animal (cat) (dog) hair and dander: Secondary | ICD-10-CM | POA: Diagnosis not present

## 2022-05-24 DIAGNOSIS — J3089 Other allergic rhinitis: Secondary | ICD-10-CM | POA: Diagnosis not present

## 2022-05-31 DIAGNOSIS — L298 Other pruritus: Secondary | ICD-10-CM | POA: Diagnosis not present

## 2022-05-31 DIAGNOSIS — K130 Diseases of lips: Secondary | ICD-10-CM | POA: Diagnosis not present

## 2022-05-31 DIAGNOSIS — D1801 Hemangioma of skin and subcutaneous tissue: Secondary | ICD-10-CM | POA: Diagnosis not present

## 2022-05-31 DIAGNOSIS — L821 Other seborrheic keratosis: Secondary | ICD-10-CM | POA: Diagnosis not present

## 2022-05-31 DIAGNOSIS — D225 Melanocytic nevi of trunk: Secondary | ICD-10-CM | POA: Diagnosis not present

## 2022-06-01 DIAGNOSIS — J301 Allergic rhinitis due to pollen: Secondary | ICD-10-CM | POA: Diagnosis not present

## 2022-06-01 DIAGNOSIS — J3081 Allergic rhinitis due to animal (cat) (dog) hair and dander: Secondary | ICD-10-CM | POA: Diagnosis not present

## 2022-06-01 DIAGNOSIS — J3089 Other allergic rhinitis: Secondary | ICD-10-CM | POA: Diagnosis not present

## 2022-06-14 DIAGNOSIS — J301 Allergic rhinitis due to pollen: Secondary | ICD-10-CM | POA: Diagnosis not present

## 2022-06-14 DIAGNOSIS — J3081 Allergic rhinitis due to animal (cat) (dog) hair and dander: Secondary | ICD-10-CM | POA: Diagnosis not present

## 2022-06-14 DIAGNOSIS — J3089 Other allergic rhinitis: Secondary | ICD-10-CM | POA: Diagnosis not present

## 2022-06-25 DIAGNOSIS — Z9889 Other specified postprocedural states: Secondary | ICD-10-CM | POA: Diagnosis not present

## 2022-06-25 DIAGNOSIS — H608X2 Other otitis externa, left ear: Secondary | ICD-10-CM | POA: Diagnosis not present

## 2022-06-25 DIAGNOSIS — K117 Disturbances of salivary secretion: Secondary | ICD-10-CM | POA: Diagnosis not present

## 2022-06-25 DIAGNOSIS — Z974 Presence of external hearing-aid: Secondary | ICD-10-CM | POA: Diagnosis not present

## 2022-06-25 DIAGNOSIS — H90A22 Sensorineural hearing loss, unilateral, left ear, with restricted hearing on the contralateral side: Secondary | ICD-10-CM | POA: Diagnosis not present

## 2022-06-25 DIAGNOSIS — J3081 Allergic rhinitis due to animal (cat) (dog) hair and dander: Secondary | ICD-10-CM | POA: Diagnosis not present

## 2022-06-25 DIAGNOSIS — H6062 Unspecified chronic otitis externa, left ear: Secondary | ICD-10-CM | POA: Diagnosis not present

## 2022-06-25 DIAGNOSIS — Z9622 Myringotomy tube(s) status: Secondary | ICD-10-CM | POA: Diagnosis not present

## 2022-06-25 DIAGNOSIS — H903 Sensorineural hearing loss, bilateral: Secondary | ICD-10-CM | POA: Diagnosis not present

## 2022-06-25 DIAGNOSIS — H90A31 Mixed conductive and sensorineural hearing loss, unilateral, right ear with restricted hearing on the contralateral side: Secondary | ICD-10-CM | POA: Diagnosis not present

## 2022-06-25 DIAGNOSIS — H6993 Unspecified Eustachian tube disorder, bilateral: Secondary | ICD-10-CM | POA: Diagnosis not present

## 2022-06-25 DIAGNOSIS — H6983 Other specified disorders of Eustachian tube, bilateral: Secondary | ICD-10-CM | POA: Diagnosis not present

## 2022-06-25 DIAGNOSIS — Z79899 Other long term (current) drug therapy: Secondary | ICD-10-CM | POA: Diagnosis not present

## 2022-06-28 DIAGNOSIS — J3089 Other allergic rhinitis: Secondary | ICD-10-CM | POA: Diagnosis not present

## 2022-06-28 DIAGNOSIS — J3081 Allergic rhinitis due to animal (cat) (dog) hair and dander: Secondary | ICD-10-CM | POA: Diagnosis not present

## 2022-06-28 DIAGNOSIS — J301 Allergic rhinitis due to pollen: Secondary | ICD-10-CM | POA: Diagnosis not present

## 2022-07-01 DIAGNOSIS — Z1231 Encounter for screening mammogram for malignant neoplasm of breast: Secondary | ICD-10-CM | POA: Diagnosis not present

## 2022-07-01 DIAGNOSIS — Z853 Personal history of malignant neoplasm of breast: Secondary | ICD-10-CM | POA: Diagnosis not present

## 2022-07-05 DIAGNOSIS — J3081 Allergic rhinitis due to animal (cat) (dog) hair and dander: Secondary | ICD-10-CM | POA: Diagnosis not present

## 2022-07-05 DIAGNOSIS — J301 Allergic rhinitis due to pollen: Secondary | ICD-10-CM | POA: Diagnosis not present

## 2022-07-05 DIAGNOSIS — J3089 Other allergic rhinitis: Secondary | ICD-10-CM | POA: Diagnosis not present

## 2022-07-07 DIAGNOSIS — H04123 Dry eye syndrome of bilateral lacrimal glands: Secondary | ICD-10-CM | POA: Diagnosis not present

## 2022-07-07 DIAGNOSIS — H00015 Hordeolum externum left lower eyelid: Secondary | ICD-10-CM | POA: Diagnosis not present

## 2022-07-13 DIAGNOSIS — R682 Dry mouth, unspecified: Secondary | ICD-10-CM | POA: Diagnosis not present

## 2022-07-13 DIAGNOSIS — Z79899 Other long term (current) drug therapy: Secondary | ICD-10-CM | POA: Diagnosis not present

## 2022-07-13 DIAGNOSIS — N3281 Overactive bladder: Secondary | ICD-10-CM | POA: Diagnosis not present

## 2022-07-13 DIAGNOSIS — Z853 Personal history of malignant neoplasm of breast: Secondary | ICD-10-CM | POA: Diagnosis not present

## 2022-07-13 DIAGNOSIS — Z0000 Encounter for general adult medical examination without abnormal findings: Secondary | ICD-10-CM | POA: Diagnosis not present

## 2022-07-13 DIAGNOSIS — E538 Deficiency of other specified B group vitamins: Secondary | ICD-10-CM | POA: Diagnosis not present

## 2022-07-13 DIAGNOSIS — K219 Gastro-esophageal reflux disease without esophagitis: Secondary | ICD-10-CM | POA: Diagnosis not present

## 2022-07-13 DIAGNOSIS — M810 Age-related osteoporosis without current pathological fracture: Secondary | ICD-10-CM | POA: Diagnosis not present

## 2022-07-13 DIAGNOSIS — H04123 Dry eye syndrome of bilateral lacrimal glands: Secondary | ICD-10-CM | POA: Diagnosis not present

## 2022-07-13 DIAGNOSIS — R438 Other disturbances of smell and taste: Secondary | ICD-10-CM | POA: Diagnosis not present

## 2022-07-13 DIAGNOSIS — E039 Hypothyroidism, unspecified: Secondary | ICD-10-CM | POA: Diagnosis not present

## 2022-07-15 DIAGNOSIS — J301 Allergic rhinitis due to pollen: Secondary | ICD-10-CM | POA: Diagnosis not present

## 2022-07-15 DIAGNOSIS — J3089 Other allergic rhinitis: Secondary | ICD-10-CM | POA: Diagnosis not present

## 2022-07-28 DIAGNOSIS — J301 Allergic rhinitis due to pollen: Secondary | ICD-10-CM | POA: Diagnosis not present

## 2022-07-28 DIAGNOSIS — J3089 Other allergic rhinitis: Secondary | ICD-10-CM | POA: Diagnosis not present

## 2022-07-30 DIAGNOSIS — D3132 Benign neoplasm of left choroid: Secondary | ICD-10-CM | POA: Diagnosis not present

## 2022-07-30 DIAGNOSIS — H524 Presbyopia: Secondary | ICD-10-CM | POA: Diagnosis not present

## 2022-07-30 DIAGNOSIS — H04123 Dry eye syndrome of bilateral lacrimal glands: Secondary | ICD-10-CM | POA: Diagnosis not present

## 2022-07-30 DIAGNOSIS — Z961 Presence of intraocular lens: Secondary | ICD-10-CM | POA: Diagnosis not present

## 2022-08-04 DIAGNOSIS — J301 Allergic rhinitis due to pollen: Secondary | ICD-10-CM | POA: Diagnosis not present

## 2022-08-04 DIAGNOSIS — J3081 Allergic rhinitis due to animal (cat) (dog) hair and dander: Secondary | ICD-10-CM | POA: Diagnosis not present

## 2022-08-04 DIAGNOSIS — J3089 Other allergic rhinitis: Secondary | ICD-10-CM | POA: Diagnosis not present

## 2022-08-17 DIAGNOSIS — J3081 Allergic rhinitis due to animal (cat) (dog) hair and dander: Secondary | ICD-10-CM | POA: Diagnosis not present

## 2022-08-17 DIAGNOSIS — J3089 Other allergic rhinitis: Secondary | ICD-10-CM | POA: Diagnosis not present

## 2022-08-17 DIAGNOSIS — J301 Allergic rhinitis due to pollen: Secondary | ICD-10-CM | POA: Diagnosis not present

## 2022-08-27 DIAGNOSIS — R030 Elevated blood-pressure reading, without diagnosis of hypertension: Secondary | ICD-10-CM | POA: Diagnosis not present

## 2022-08-27 DIAGNOSIS — R739 Hyperglycemia, unspecified: Secondary | ICD-10-CM | POA: Diagnosis not present

## 2022-08-27 DIAGNOSIS — E538 Deficiency of other specified B group vitamins: Secondary | ICD-10-CM | POA: Diagnosis not present

## 2022-08-27 DIAGNOSIS — Z79899 Other long term (current) drug therapy: Secondary | ICD-10-CM | POA: Diagnosis not present

## 2022-08-31 DIAGNOSIS — R682 Dry mouth, unspecified: Secondary | ICD-10-CM | POA: Diagnosis not present

## 2022-08-31 DIAGNOSIS — R0981 Nasal congestion: Secondary | ICD-10-CM | POA: Diagnosis not present

## 2022-09-09 DIAGNOSIS — J3089 Other allergic rhinitis: Secondary | ICD-10-CM | POA: Diagnosis not present

## 2022-09-09 DIAGNOSIS — J301 Allergic rhinitis due to pollen: Secondary | ICD-10-CM | POA: Diagnosis not present

## 2022-09-09 DIAGNOSIS — J3081 Allergic rhinitis due to animal (cat) (dog) hair and dander: Secondary | ICD-10-CM | POA: Diagnosis not present

## 2022-09-15 DIAGNOSIS — J301 Allergic rhinitis due to pollen: Secondary | ICD-10-CM | POA: Diagnosis not present

## 2022-09-15 DIAGNOSIS — J3089 Other allergic rhinitis: Secondary | ICD-10-CM | POA: Diagnosis not present

## 2022-09-23 DIAGNOSIS — J3089 Other allergic rhinitis: Secondary | ICD-10-CM | POA: Diagnosis not present

## 2022-09-23 DIAGNOSIS — J301 Allergic rhinitis due to pollen: Secondary | ICD-10-CM | POA: Diagnosis not present

## 2022-09-30 DIAGNOSIS — H90A31 Mixed conductive and sensorineural hearing loss, unilateral, right ear with restricted hearing on the contralateral side: Secondary | ICD-10-CM | POA: Diagnosis not present

## 2022-09-30 DIAGNOSIS — H6993 Unspecified Eustachian tube disorder, bilateral: Secondary | ICD-10-CM | POA: Diagnosis not present

## 2022-09-30 DIAGNOSIS — R448 Other symptoms and signs involving general sensations and perceptions: Secondary | ICD-10-CM | POA: Diagnosis not present

## 2022-09-30 DIAGNOSIS — R432 Parageusia: Secondary | ICD-10-CM | POA: Diagnosis not present

## 2022-09-30 DIAGNOSIS — Z4589 Encounter for adjustment and management of other implanted devices: Secondary | ICD-10-CM | POA: Diagnosis not present

## 2022-09-30 DIAGNOSIS — Z9889 Other specified postprocedural states: Secondary | ICD-10-CM | POA: Diagnosis not present

## 2022-09-30 DIAGNOSIS — H903 Sensorineural hearing loss, bilateral: Secondary | ICD-10-CM | POA: Diagnosis not present

## 2022-10-01 DIAGNOSIS — I10 Essential (primary) hypertension: Secondary | ICD-10-CM | POA: Diagnosis not present

## 2022-10-01 DIAGNOSIS — R42 Dizziness and giddiness: Secondary | ICD-10-CM | POA: Diagnosis not present

## 2022-10-01 DIAGNOSIS — R55 Syncope and collapse: Secondary | ICD-10-CM | POA: Diagnosis not present

## 2022-10-05 DIAGNOSIS — I10 Essential (primary) hypertension: Secondary | ICD-10-CM | POA: Diagnosis not present

## 2022-10-05 DIAGNOSIS — R438 Other disturbances of smell and taste: Secondary | ICD-10-CM | POA: Diagnosis not present

## 2022-10-05 DIAGNOSIS — K219 Gastro-esophageal reflux disease without esophagitis: Secondary | ICD-10-CM | POA: Diagnosis not present

## 2022-10-07 DIAGNOSIS — J342 Deviated nasal septum: Secondary | ICD-10-CM | POA: Diagnosis not present

## 2022-10-12 DIAGNOSIS — J3081 Allergic rhinitis due to animal (cat) (dog) hair and dander: Secondary | ICD-10-CM | POA: Diagnosis not present

## 2022-10-12 DIAGNOSIS — J3089 Other allergic rhinitis: Secondary | ICD-10-CM | POA: Diagnosis not present

## 2022-10-12 DIAGNOSIS — J301 Allergic rhinitis due to pollen: Secondary | ICD-10-CM | POA: Diagnosis not present

## 2022-10-27 DIAGNOSIS — I10 Essential (primary) hypertension: Secondary | ICD-10-CM | POA: Diagnosis not present

## 2022-10-27 DIAGNOSIS — K219 Gastro-esophageal reflux disease without esophagitis: Secondary | ICD-10-CM | POA: Diagnosis not present

## 2022-11-16 DIAGNOSIS — J301 Allergic rhinitis due to pollen: Secondary | ICD-10-CM | POA: Diagnosis not present

## 2022-11-16 DIAGNOSIS — J3089 Other allergic rhinitis: Secondary | ICD-10-CM | POA: Diagnosis not present

## 2022-11-16 DIAGNOSIS — J3081 Allergic rhinitis due to animal (cat) (dog) hair and dander: Secondary | ICD-10-CM | POA: Diagnosis not present

## 2022-11-22 DIAGNOSIS — J3089 Other allergic rhinitis: Secondary | ICD-10-CM | POA: Diagnosis not present

## 2022-11-22 DIAGNOSIS — J301 Allergic rhinitis due to pollen: Secondary | ICD-10-CM | POA: Diagnosis not present

## 2022-11-22 DIAGNOSIS — J3081 Allergic rhinitis due to animal (cat) (dog) hair and dander: Secondary | ICD-10-CM | POA: Diagnosis not present

## 2022-11-29 DIAGNOSIS — J3089 Other allergic rhinitis: Secondary | ICD-10-CM | POA: Diagnosis not present

## 2022-11-29 DIAGNOSIS — J3081 Allergic rhinitis due to animal (cat) (dog) hair and dander: Secondary | ICD-10-CM | POA: Diagnosis not present

## 2022-11-29 DIAGNOSIS — J301 Allergic rhinitis due to pollen: Secondary | ICD-10-CM | POA: Diagnosis not present

## 2022-12-10 DIAGNOSIS — J3081 Allergic rhinitis due to animal (cat) (dog) hair and dander: Secondary | ICD-10-CM | POA: Diagnosis not present

## 2022-12-10 DIAGNOSIS — J301 Allergic rhinitis due to pollen: Secondary | ICD-10-CM | POA: Diagnosis not present

## 2022-12-10 DIAGNOSIS — J3089 Other allergic rhinitis: Secondary | ICD-10-CM | POA: Diagnosis not present

## 2022-12-22 DIAGNOSIS — J3081 Allergic rhinitis due to animal (cat) (dog) hair and dander: Secondary | ICD-10-CM | POA: Diagnosis not present

## 2022-12-22 DIAGNOSIS — J3089 Other allergic rhinitis: Secondary | ICD-10-CM | POA: Diagnosis not present

## 2022-12-22 DIAGNOSIS — J301 Allergic rhinitis due to pollen: Secondary | ICD-10-CM | POA: Diagnosis not present

## 2022-12-30 DIAGNOSIS — H90A31 Mixed conductive and sensorineural hearing loss, unilateral, right ear with restricted hearing on the contralateral side: Secondary | ICD-10-CM | POA: Diagnosis not present

## 2022-12-30 DIAGNOSIS — H903 Sensorineural hearing loss, bilateral: Secondary | ICD-10-CM | POA: Diagnosis not present

## 2022-12-30 DIAGNOSIS — J309 Allergic rhinitis, unspecified: Secondary | ICD-10-CM | POA: Diagnosis not present

## 2022-12-30 DIAGNOSIS — Z9889 Other specified postprocedural states: Secondary | ICD-10-CM | POA: Diagnosis not present

## 2023-01-06 DIAGNOSIS — J301 Allergic rhinitis due to pollen: Secondary | ICD-10-CM | POA: Diagnosis not present

## 2023-01-06 DIAGNOSIS — J3081 Allergic rhinitis due to animal (cat) (dog) hair and dander: Secondary | ICD-10-CM | POA: Diagnosis not present

## 2023-01-06 DIAGNOSIS — J3089 Other allergic rhinitis: Secondary | ICD-10-CM | POA: Diagnosis not present

## 2023-01-12 DIAGNOSIS — J301 Allergic rhinitis due to pollen: Secondary | ICD-10-CM | POA: Diagnosis not present

## 2023-01-12 DIAGNOSIS — J3089 Other allergic rhinitis: Secondary | ICD-10-CM | POA: Diagnosis not present

## 2023-01-13 DIAGNOSIS — J301 Allergic rhinitis due to pollen: Secondary | ICD-10-CM | POA: Diagnosis not present

## 2023-01-13 DIAGNOSIS — J3089 Other allergic rhinitis: Secondary | ICD-10-CM | POA: Diagnosis not present

## 2023-01-26 DIAGNOSIS — J3081 Allergic rhinitis due to animal (cat) (dog) hair and dander: Secondary | ICD-10-CM | POA: Diagnosis not present

## 2023-01-26 DIAGNOSIS — J301 Allergic rhinitis due to pollen: Secondary | ICD-10-CM | POA: Diagnosis not present

## 2023-01-26 DIAGNOSIS — J3089 Other allergic rhinitis: Secondary | ICD-10-CM | POA: Diagnosis not present

## 2023-01-28 DIAGNOSIS — R438 Other disturbances of smell and taste: Secondary | ICD-10-CM | POA: Diagnosis not present

## 2023-01-28 DIAGNOSIS — K219 Gastro-esophageal reflux disease without esophagitis: Secondary | ICD-10-CM | POA: Diagnosis not present

## 2023-01-28 DIAGNOSIS — N1831 Chronic kidney disease, stage 3a: Secondary | ICD-10-CM | POA: Diagnosis not present

## 2023-01-28 DIAGNOSIS — R2689 Other abnormalities of gait and mobility: Secondary | ICD-10-CM | POA: Diagnosis not present

## 2023-01-28 DIAGNOSIS — I10 Essential (primary) hypertension: Secondary | ICD-10-CM | POA: Diagnosis not present

## 2023-02-03 DIAGNOSIS — J301 Allergic rhinitis due to pollen: Secondary | ICD-10-CM | POA: Diagnosis not present

## 2023-02-03 DIAGNOSIS — J3081 Allergic rhinitis due to animal (cat) (dog) hair and dander: Secondary | ICD-10-CM | POA: Diagnosis not present

## 2023-02-03 DIAGNOSIS — J3089 Other allergic rhinitis: Secondary | ICD-10-CM | POA: Diagnosis not present

## 2023-02-17 DIAGNOSIS — J3081 Allergic rhinitis due to animal (cat) (dog) hair and dander: Secondary | ICD-10-CM | POA: Diagnosis not present

## 2023-02-17 DIAGNOSIS — J301 Allergic rhinitis due to pollen: Secondary | ICD-10-CM | POA: Diagnosis not present

## 2023-02-17 DIAGNOSIS — J3089 Other allergic rhinitis: Secondary | ICD-10-CM | POA: Diagnosis not present

## 2023-03-02 DIAGNOSIS — J3081 Allergic rhinitis due to animal (cat) (dog) hair and dander: Secondary | ICD-10-CM | POA: Diagnosis not present

## 2023-03-02 DIAGNOSIS — J3089 Other allergic rhinitis: Secondary | ICD-10-CM | POA: Diagnosis not present

## 2023-03-02 DIAGNOSIS — J301 Allergic rhinitis due to pollen: Secondary | ICD-10-CM | POA: Diagnosis not present

## 2023-03-07 DIAGNOSIS — J3089 Other allergic rhinitis: Secondary | ICD-10-CM | POA: Diagnosis not present

## 2023-03-07 DIAGNOSIS — J3081 Allergic rhinitis due to animal (cat) (dog) hair and dander: Secondary | ICD-10-CM | POA: Diagnosis not present

## 2023-03-07 DIAGNOSIS — J301 Allergic rhinitis due to pollen: Secondary | ICD-10-CM | POA: Diagnosis not present

## 2023-03-09 DIAGNOSIS — H00015 Hordeolum externum left lower eyelid: Secondary | ICD-10-CM | POA: Diagnosis not present

## 2023-03-17 DIAGNOSIS — J3081 Allergic rhinitis due to animal (cat) (dog) hair and dander: Secondary | ICD-10-CM | POA: Diagnosis not present

## 2023-03-17 DIAGNOSIS — J3089 Other allergic rhinitis: Secondary | ICD-10-CM | POA: Diagnosis not present

## 2023-03-17 DIAGNOSIS — J301 Allergic rhinitis due to pollen: Secondary | ICD-10-CM | POA: Diagnosis not present

## 2023-03-29 DIAGNOSIS — J3089 Other allergic rhinitis: Secondary | ICD-10-CM | POA: Diagnosis not present

## 2023-03-29 DIAGNOSIS — J301 Allergic rhinitis due to pollen: Secondary | ICD-10-CM | POA: Diagnosis not present

## 2023-04-08 DIAGNOSIS — J301 Allergic rhinitis due to pollen: Secondary | ICD-10-CM | POA: Diagnosis not present

## 2023-04-08 DIAGNOSIS — J3089 Other allergic rhinitis: Secondary | ICD-10-CM | POA: Diagnosis not present

## 2023-04-08 DIAGNOSIS — J3081 Allergic rhinitis due to animal (cat) (dog) hair and dander: Secondary | ICD-10-CM | POA: Diagnosis not present

## 2023-04-15 DIAGNOSIS — Z4589 Encounter for adjustment and management of other implanted devices: Secondary | ICD-10-CM | POA: Diagnosis not present

## 2023-04-15 DIAGNOSIS — H6993 Unspecified Eustachian tube disorder, bilateral: Secondary | ICD-10-CM | POA: Diagnosis not present

## 2023-04-15 DIAGNOSIS — Z9889 Other specified postprocedural states: Secondary | ICD-10-CM | POA: Diagnosis not present

## 2023-04-15 DIAGNOSIS — J309 Allergic rhinitis, unspecified: Secondary | ICD-10-CM | POA: Diagnosis not present

## 2023-04-15 DIAGNOSIS — H903 Sensorineural hearing loss, bilateral: Secondary | ICD-10-CM | POA: Diagnosis not present

## 2023-04-15 DIAGNOSIS — H608X2 Other otitis externa, left ear: Secondary | ICD-10-CM | POA: Diagnosis not present

## 2023-04-15 DIAGNOSIS — H9111 Presbycusis, right ear: Secondary | ICD-10-CM | POA: Diagnosis not present

## 2023-04-15 DIAGNOSIS — H90A31 Mixed conductive and sensorineural hearing loss, unilateral, right ear with restricted hearing on the contralateral side: Secondary | ICD-10-CM | POA: Diagnosis not present

## 2023-04-19 DIAGNOSIS — J301 Allergic rhinitis due to pollen: Secondary | ICD-10-CM | POA: Diagnosis not present

## 2023-04-19 DIAGNOSIS — J3089 Other allergic rhinitis: Secondary | ICD-10-CM | POA: Diagnosis not present

## 2023-04-19 DIAGNOSIS — J3081 Allergic rhinitis due to animal (cat) (dog) hair and dander: Secondary | ICD-10-CM | POA: Diagnosis not present

## 2023-04-26 DIAGNOSIS — J3089 Other allergic rhinitis: Secondary | ICD-10-CM | POA: Diagnosis not present

## 2023-04-26 DIAGNOSIS — J301 Allergic rhinitis due to pollen: Secondary | ICD-10-CM | POA: Diagnosis not present

## 2023-05-09 DIAGNOSIS — J3081 Allergic rhinitis due to animal (cat) (dog) hair and dander: Secondary | ICD-10-CM | POA: Diagnosis not present

## 2023-05-09 DIAGNOSIS — J3089 Other allergic rhinitis: Secondary | ICD-10-CM | POA: Diagnosis not present

## 2023-05-09 DIAGNOSIS — J301 Allergic rhinitis due to pollen: Secondary | ICD-10-CM | POA: Diagnosis not present

## 2023-05-16 DIAGNOSIS — J301 Allergic rhinitis due to pollen: Secondary | ICD-10-CM | POA: Diagnosis not present

## 2023-05-16 DIAGNOSIS — J3089 Other allergic rhinitis: Secondary | ICD-10-CM | POA: Diagnosis not present

## 2023-05-16 DIAGNOSIS — J3081 Allergic rhinitis due to animal (cat) (dog) hair and dander: Secondary | ICD-10-CM | POA: Diagnosis not present

## 2023-05-25 DIAGNOSIS — J3089 Other allergic rhinitis: Secondary | ICD-10-CM | POA: Diagnosis not present

## 2023-05-25 DIAGNOSIS — J301 Allergic rhinitis due to pollen: Secondary | ICD-10-CM | POA: Diagnosis not present

## 2023-06-06 DIAGNOSIS — J3089 Other allergic rhinitis: Secondary | ICD-10-CM | POA: Diagnosis not present

## 2023-06-06 DIAGNOSIS — J301 Allergic rhinitis due to pollen: Secondary | ICD-10-CM | POA: Diagnosis not present

## 2023-06-06 DIAGNOSIS — J3081 Allergic rhinitis due to animal (cat) (dog) hair and dander: Secondary | ICD-10-CM | POA: Diagnosis not present

## 2023-06-17 DIAGNOSIS — J3089 Other allergic rhinitis: Secondary | ICD-10-CM | POA: Diagnosis not present

## 2023-06-17 DIAGNOSIS — J301 Allergic rhinitis due to pollen: Secondary | ICD-10-CM | POA: Diagnosis not present

## 2023-06-17 DIAGNOSIS — J3081 Allergic rhinitis due to animal (cat) (dog) hair and dander: Secondary | ICD-10-CM | POA: Diagnosis not present

## 2023-07-04 DIAGNOSIS — J301 Allergic rhinitis due to pollen: Secondary | ICD-10-CM | POA: Diagnosis not present

## 2023-07-04 DIAGNOSIS — J3081 Allergic rhinitis due to animal (cat) (dog) hair and dander: Secondary | ICD-10-CM | POA: Diagnosis not present

## 2023-07-04 DIAGNOSIS — J3089 Other allergic rhinitis: Secondary | ICD-10-CM | POA: Diagnosis not present

## 2023-07-04 DIAGNOSIS — Z853 Personal history of malignant neoplasm of breast: Secondary | ICD-10-CM | POA: Diagnosis not present

## 2023-07-04 DIAGNOSIS — Z1231 Encounter for screening mammogram for malignant neoplasm of breast: Secondary | ICD-10-CM | POA: Diagnosis not present

## 2023-07-08 DIAGNOSIS — Z9889 Other specified postprocedural states: Secondary | ICD-10-CM | POA: Diagnosis not present

## 2023-07-08 DIAGNOSIS — J301 Allergic rhinitis due to pollen: Secondary | ICD-10-CM | POA: Diagnosis not present

## 2023-07-08 DIAGNOSIS — H6993 Unspecified Eustachian tube disorder, bilateral: Secondary | ICD-10-CM | POA: Diagnosis not present

## 2023-07-08 DIAGNOSIS — Z4589 Encounter for adjustment and management of other implanted devices: Secondary | ICD-10-CM | POA: Diagnosis not present

## 2023-07-08 DIAGNOSIS — H608X2 Other otitis externa, left ear: Secondary | ICD-10-CM | POA: Diagnosis not present

## 2023-07-08 DIAGNOSIS — H903 Sensorineural hearing loss, bilateral: Secondary | ICD-10-CM | POA: Diagnosis not present

## 2023-07-08 DIAGNOSIS — H90A31 Mixed conductive and sensorineural hearing loss, unilateral, right ear with restricted hearing on the contralateral side: Secondary | ICD-10-CM | POA: Diagnosis not present

## 2023-07-15 DIAGNOSIS — J3081 Allergic rhinitis due to animal (cat) (dog) hair and dander: Secondary | ICD-10-CM | POA: Diagnosis not present

## 2023-07-15 DIAGNOSIS — J3089 Other allergic rhinitis: Secondary | ICD-10-CM | POA: Diagnosis not present

## 2023-07-15 DIAGNOSIS — J301 Allergic rhinitis due to pollen: Secondary | ICD-10-CM | POA: Diagnosis not present

## 2023-07-25 DIAGNOSIS — J3081 Allergic rhinitis due to animal (cat) (dog) hair and dander: Secondary | ICD-10-CM | POA: Diagnosis not present

## 2023-07-25 DIAGNOSIS — J3089 Other allergic rhinitis: Secondary | ICD-10-CM | POA: Diagnosis not present

## 2023-07-25 DIAGNOSIS — J301 Allergic rhinitis due to pollen: Secondary | ICD-10-CM | POA: Diagnosis not present

## 2023-08-01 DIAGNOSIS — Z0000 Encounter for general adult medical examination without abnormal findings: Secondary | ICD-10-CM | POA: Diagnosis not present

## 2023-08-01 DIAGNOSIS — E039 Hypothyroidism, unspecified: Secondary | ICD-10-CM | POA: Diagnosis not present

## 2023-08-01 DIAGNOSIS — K219 Gastro-esophageal reflux disease without esophagitis: Secondary | ICD-10-CM | POA: Diagnosis not present

## 2023-08-01 DIAGNOSIS — E538 Deficiency of other specified B group vitamins: Secondary | ICD-10-CM | POA: Diagnosis not present

## 2023-08-01 DIAGNOSIS — N3281 Overactive bladder: Secondary | ICD-10-CM | POA: Diagnosis not present

## 2023-08-01 DIAGNOSIS — M810 Age-related osteoporosis without current pathological fracture: Secondary | ICD-10-CM | POA: Diagnosis not present

## 2023-08-01 DIAGNOSIS — R682 Dry mouth, unspecified: Secondary | ICD-10-CM | POA: Diagnosis not present

## 2023-08-01 DIAGNOSIS — Z853 Personal history of malignant neoplasm of breast: Secondary | ICD-10-CM | POA: Diagnosis not present

## 2023-08-01 DIAGNOSIS — N1831 Chronic kidney disease, stage 3a: Secondary | ICD-10-CM | POA: Diagnosis not present

## 2023-08-01 DIAGNOSIS — Z23 Encounter for immunization: Secondary | ICD-10-CM | POA: Diagnosis not present

## 2023-08-01 DIAGNOSIS — I10 Essential (primary) hypertension: Secondary | ICD-10-CM | POA: Diagnosis not present

## 2023-08-01 DIAGNOSIS — Z79899 Other long term (current) drug therapy: Secondary | ICD-10-CM | POA: Diagnosis not present

## 2023-08-11 DIAGNOSIS — J3089 Other allergic rhinitis: Secondary | ICD-10-CM | POA: Diagnosis not present

## 2023-08-11 DIAGNOSIS — J301 Allergic rhinitis due to pollen: Secondary | ICD-10-CM | POA: Diagnosis not present

## 2023-08-12 DIAGNOSIS — H524 Presbyopia: Secondary | ICD-10-CM | POA: Diagnosis not present

## 2023-08-12 DIAGNOSIS — H5000 Unspecified esotropia: Secondary | ICD-10-CM | POA: Diagnosis not present

## 2023-08-12 DIAGNOSIS — H04123 Dry eye syndrome of bilateral lacrimal glands: Secondary | ICD-10-CM | POA: Diagnosis not present

## 2023-08-12 DIAGNOSIS — H52203 Unspecified astigmatism, bilateral: Secondary | ICD-10-CM | POA: Diagnosis not present

## 2023-08-12 DIAGNOSIS — D3132 Benign neoplasm of left choroid: Secondary | ICD-10-CM | POA: Diagnosis not present

## 2023-08-12 DIAGNOSIS — Z961 Presence of intraocular lens: Secondary | ICD-10-CM | POA: Diagnosis not present

## 2023-08-18 DIAGNOSIS — J3089 Other allergic rhinitis: Secondary | ICD-10-CM | POA: Diagnosis not present

## 2023-08-18 DIAGNOSIS — J301 Allergic rhinitis due to pollen: Secondary | ICD-10-CM | POA: Diagnosis not present

## 2023-08-25 DIAGNOSIS — J3089 Other allergic rhinitis: Secondary | ICD-10-CM | POA: Diagnosis not present

## 2023-08-25 DIAGNOSIS — J301 Allergic rhinitis due to pollen: Secondary | ICD-10-CM | POA: Diagnosis not present

## 2023-08-26 DIAGNOSIS — J3089 Other allergic rhinitis: Secondary | ICD-10-CM | POA: Diagnosis not present

## 2023-08-26 DIAGNOSIS — J301 Allergic rhinitis due to pollen: Secondary | ICD-10-CM | POA: Diagnosis not present

## 2023-09-02 DIAGNOSIS — J3081 Allergic rhinitis due to animal (cat) (dog) hair and dander: Secondary | ICD-10-CM | POA: Diagnosis not present

## 2023-09-02 DIAGNOSIS — J301 Allergic rhinitis due to pollen: Secondary | ICD-10-CM | POA: Diagnosis not present

## 2023-09-02 DIAGNOSIS — J3089 Other allergic rhinitis: Secondary | ICD-10-CM | POA: Diagnosis not present

## 2023-09-07 DIAGNOSIS — L821 Other seborrheic keratosis: Secondary | ICD-10-CM | POA: Diagnosis not present

## 2023-09-07 DIAGNOSIS — L570 Actinic keratosis: Secondary | ICD-10-CM | POA: Diagnosis not present

## 2023-09-07 DIAGNOSIS — D225 Melanocytic nevi of trunk: Secondary | ICD-10-CM | POA: Diagnosis not present

## 2023-09-07 DIAGNOSIS — D1801 Hemangioma of skin and subcutaneous tissue: Secondary | ICD-10-CM | POA: Diagnosis not present

## 2023-09-12 DIAGNOSIS — S82402A Unspecified fracture of shaft of left fibula, initial encounter for closed fracture: Secondary | ICD-10-CM | POA: Diagnosis not present

## 2023-09-12 DIAGNOSIS — M25572 Pain in left ankle and joints of left foot: Secondary | ICD-10-CM | POA: Diagnosis not present

## 2023-09-12 DIAGNOSIS — W19XXXA Unspecified fall, initial encounter: Secondary | ICD-10-CM | POA: Diagnosis not present

## 2023-09-13 DIAGNOSIS — S8262XA Displaced fracture of lateral malleolus of left fibula, initial encounter for closed fracture: Secondary | ICD-10-CM | POA: Diagnosis not present

## 2023-09-16 ENCOUNTER — Other Ambulatory Visit: Payer: Self-pay | Admitting: Orthopaedic Surgery

## 2023-09-16 ENCOUNTER — Other Ambulatory Visit: Payer: Self-pay

## 2023-09-16 ENCOUNTER — Encounter (HOSPITAL_BASED_OUTPATIENT_CLINIC_OR_DEPARTMENT_OTHER): Payer: Self-pay | Admitting: Orthopaedic Surgery

## 2023-09-16 ENCOUNTER — Ambulatory Visit
Admission: RE | Admit: 2023-09-16 | Discharge: 2023-09-16 | Disposition: A | Discharge status: Home / Self Care | Payer: Medicare Other | Source: Ambulatory Visit | Attending: Orthopaedic Surgery | Admitting: Orthopaedic Surgery

## 2023-09-16 DIAGNOSIS — S8262XA Displaced fracture of lateral malleolus of left fibula, initial encounter for closed fracture: Secondary | ICD-10-CM

## 2023-09-16 DIAGNOSIS — S72332A Displaced oblique fracture of shaft of left femur, initial encounter for closed fracture: Secondary | ICD-10-CM | POA: Diagnosis not present

## 2023-09-16 DIAGNOSIS — J301 Allergic rhinitis due to pollen: Secondary | ICD-10-CM | POA: Diagnosis not present

## 2023-09-16 DIAGNOSIS — S8261XA Displaced fracture of lateral malleolus of right fibula, initial encounter for closed fracture: Secondary | ICD-10-CM

## 2023-09-16 DIAGNOSIS — M7989 Other specified soft tissue disorders: Secondary | ICD-10-CM | POA: Diagnosis not present

## 2023-09-16 DIAGNOSIS — J3089 Other allergic rhinitis: Secondary | ICD-10-CM | POA: Diagnosis not present

## 2023-09-16 NOTE — H&P (Signed)
 ORTHOPAEDIC SURGERY H&P  Subjective:  The patient presents with left ankle fx.   Past Medical History:  Diagnosis Date   Anxiety    Takes Zoloft, follows w/ PCP Dr. Kirby Funk at [facility].   Arthritis    knees   Cancer ([facility]) 1991   right breast cancer, s/p chemotherapy and radiation   Dupuytren's contracture    left little finger   GERD (gastroesophageal reflux disease)    Follows with [facility], takes Omeprazole.   History of hiatal hernia    Hypothyroidism    Follows w/ PCP Dr.  Kirby Funk at [facility].   Lupus    cutaneous, very mild   Multiple allergies    Takes weekly allergy shots as of 03/26/22.   Wears glasses    Wears hearing aid    right ear    Past Surgical History:  Procedure Laterality Date   BREAST LUMPECTOMY Right 1991   COLONOSCOPY     hx of several   CYSTOCELE REPAIR N/A 04/12/2022   Procedure: ANTERIOR REPAIR (CYSTOCELE);  Surgeon: Marcelle Overlie, MD;  Location: [facility];  Service: Gynecology;  Laterality: N/A;   DILATION AND CURETTAGE OF UTERUS  2011   MASTOIDECTOMY Bilateral    1991 and 2002   RECTOCELE REPAIR N/A 04/12/2022   Procedure: POSTERIOR REPAIR (RECTOCELE);  Surgeon: Marcelle Overlie, MD;  Location: [facility];  Service: Gynecology;  Laterality: N/A;   TONSILLECTOMY     as a child   TUBAL LIGATION  1974   TYMPANOMASTOIDECTOMY Right 05/08/2013   Procedure: RIGHT  CANAL-UP TYMPANOMASTOIDECTOMY; MICRODISSECTION USING THE OR MICROSCOPE; PLACEMENT OF PAPARELLA TUBE IN RIGHT EAR ;  Surgeon: Carolan Shiver, MD;  Location: [facility];  Service: ENT;  Laterality: Right;   TYMPANOSTOMY TUBE PLACEMENT     multiple times per pt   VAGINAL HYSTERECTOMY Bilateral 04/12/2022   Procedure: TOTAL VAGINAL HYSTERECTOMY;  Surgeon: Marcelle Overlie, MD;  Location: [facility] [place];  Service: Gynecology;  Laterality: Bilateral;     (Not in an outpatient encounter)     Allergies  Allergen Reactions   Sulfa Antibiotics     Mouth blisters   Tape     Blisters skin    Social History   Socioeconomic History   Marital status: Married    Spouse name: Arville Care   Number of children: Not on file   Years of education: Not on file   Highest education level: Not on file  Occupational History   Not on file  Tobacco Use   Smoking status: Never   Smokeless tobacco: Never  Vaping Use   Vaping status: Never Used  Substance and Sexual Activity   Alcohol use: No   Drug use: Never   Sexual activity: Not on file  Other Topics Concern   Not on file  Social History Narrative   Not on file   Social Drivers of Health   Financial Resource Strain: Not on file  Food Insecurity: Low Risk  (04/15/2023)   Received from [facility]   Hunger Vital Sign    Worried About Running Out of Food in the Last Year: Never true    Ran Out of Food in the Last Year: Never true  Transportation Needs: No Transportation Needs (04/15/2023)   Received from [facility]    In the past 12 months, has lack of reliable transportation kept you from medical appointments, meetings, work or from getting things needed  for daily living? : No  Physical Activity: Not on file  Stress: Not on file  Social Connections: Not on file  Intimate Partner Violence: Not on file     History reviewed. No pertinent family history.   Review of Systems Pertinent items are noted in HPI.  Objective: Vital signs in last 24 hours:    09/16/2023    1:07 PM 04/13/2022    8:14 AM 04/13/2022    4:53 AM  Vitals with BMI  Height 4\' 10"     Weight 140 lbs    BMI 29.27    Systolic  113 110  Diastolic  69 64  Pulse  67 67      EXAM: General: Well nourished, well developed. Awake, alert and oriented to time, place, person. Normal mood and affect. No apparent distress. Breathing room air.  Operative Lower Extremity: Alignment - Neutral Deformity - None Skin intact Tenderness to  palpation - left ankle  5/5 TA, PT, GS, Per, EHL, FHL Sensation intact to light touch throughout Palpable DP and PT pulses Special testing: None  The contralateral foot/ankle was examined for comparison and noted to be neurovascularly intact with no localized deformity, swelling, or tenderness.  Imaging Review All images taken were independently reviewed by me.  Assessment/Plan: The clinical and radiographic findings were reviewed and discussed at length with the patient.  The patient has left ankle fx.  We spoke at length about the natural course of these findings. We discussed nonoperative and operative treatment options in detail.  The risks and benefits were presented and reviewed. The risks due to hardware failure/irritation, new/persistent/recurrent infection, stiffness, nerve/vessel/tendon injury, nonunion/malunion of any fracture, wound healing issues, allograft usage, development of arthritis, failure of this surgery, possibility of external fixation in certain situations, possibility of delayed definitive surgery, need for further surgery, prolonged wound care including further soft tissue coverage procedures, thromboembolic events, anesthesia/medical complications/events perioperatively and beyond, amputation, death among others were discussed. The patient acknowledged the explanation and agreed to proceed with the plan.  Netta Cedars  Orthopaedic Surgery [facility]

## 2023-09-16 NOTE — Discharge Instructions (Addendum)
 Netta Cedars, MD [facility]  Please read the following information regarding your care after surgery.  Medications  You only need a prescription for the narcotic pain medicine (ex. oxycodone, Percocet, Norco).  All of the other medicines listed below are available over the counter. ? Aleve 2 pills twice a day for the first 3 days after surgery. ? acetominophen (Tylenol) 650 mg every 4-6 hours as you need for minor to moderate pain ? oxycodone as prescribed for severe pain  ? To help prevent blood clots, take aspirin (81 mg) twice daily for at least 42 days after surgery.  You should also get up every hour while you are awake to move around.  Weight Bearing ? Do NOT bear any weight on the operated leg or foot. This means do NOT touch your surgical leg to the ground!  Cast / Splint / Dressing ? If you have a splint, do NOT remove this. Keep your splint, cast or dressing clean and dry.  Don't put anything (coat hanger, pencil, etc) down inside of it.  If it gets wet, call the office immediately to schedule an appointment for a cast change.  Swelling IMPORTANT: It is normal for you to have swelling where you had surgery. To reduce swelling and pain, keep at least 3 pillows under your leg so that your toes are above your nose and your heel is above the level of your hip.  It Florabel be necessary to keep your foot or leg elevated for several weeks.  This is critical to helping your incisions heal and your pain to feel better.  Follow Up Call my office at [phone] when you are discharged from the hospital or surgery center to schedule an appointment to be seen 7-10 days after surgery.  Call my office at [phone] if you develop a fever >101.5 F, nausea, vomiting, bleeding from the surgical site or severe pain.     No Tylenol before 4:45pm if needed.   Post Anesthesia Home Care Instructions  Activity: Get plenty of rest for the remainder of the day. A responsible individual must  stay with you for 24 hours following the procedure.  For the next 24 hours, DO NOT: -Drive a car -Advertising copywriter -Drink alcoholic beverages -Take any medication unless instructed by your physician -Make any legal decisions or sign important papers.  Meals: Start with liquid foods such as gelatin or soup. Progress to regular foods as tolerated. Avoid greasy, spicy, heavy foods. If nausea and/or vomiting occur, drink only clear liquids until the nausea and/or vomiting subsides. Call your physician if vomiting continues.  Special Instructions/Symptoms: Your throat Lonya feel dry or sore from the anesthesia or the breathing tube placed in your throat during surgery. If this causes discomfort, gargle with warm salt water. The discomfort should disappear within 24 hours.     Regional Anesthesia Blocks  1. You Ottis not be able to move or feel the "blocked" extremity after a regional anesthetic block. This Cerise last Skylynn last from 3-48 hours after placement, but it will go away. The length of time depends on the medication injected and your individual response to the medication. As the nerves start to wake up, you Mega experience tingling as the movement and feeling returns to your extremity. If the numbness and inability to move your extremity has not gone away after 48 hours, please call your surgeon.   2. The extremity that is blocked will need to be protected until the numbness is gone and the strength  has returned. Because you cannot feel it, you will need to take extra care to avoid injury. Because it Feige be weak, you Milta have difficulty moving it or using it. You Zakyra not know what position it is in without looking at it while the block is in effect.  3. For blocks in the legs and feet, returning to weight bearing and walking needs to be done carefully. You will need to wait until the numbness is entirely gone and the strength has returned. You should be able to move your leg and foot normally before  you try and bear weight or walk. You will need someone to be with you when you first try to ensure you do not fall and possibly risk injury.  4. Bruising and tenderness at the needle site are common side effects and will resolve in a few days.  5. Persistent numbness or new problems with movement should be communicated to the surgeon or the [facility] [phone] [facility] [phone]).

## 2023-09-21 ENCOUNTER — Ambulatory Visit (HOSPITAL_COMMUNITY): Payer: Medicare Other

## 2023-09-21 ENCOUNTER — Other Ambulatory Visit: Payer: Self-pay

## 2023-09-21 ENCOUNTER — Ambulatory Visit (HOSPITAL_BASED_OUTPATIENT_CLINIC_OR_DEPARTMENT_OTHER): Payer: Medicare Other | Admitting: Anesthesiology

## 2023-09-21 ENCOUNTER — Ambulatory Visit (HOSPITAL_BASED_OUTPATIENT_CLINIC_OR_DEPARTMENT_OTHER)
Admission: RE | Admit: 2023-09-21 | Discharge: 2023-09-21 | Disposition: A | Discharge status: Home / Self Care | Payer: Medicare Other | Attending: Orthopaedic Surgery | Admitting: Orthopaedic Surgery

## 2023-09-21 ENCOUNTER — Encounter (HOSPITAL_BASED_OUTPATIENT_CLINIC_OR_DEPARTMENT_OTHER)
Admission: RE | Disposition: A | Discharge status: Home / Self Care | Payer: Self-pay | Source: Home / Self Care | Attending: Orthopaedic Surgery

## 2023-09-21 ENCOUNTER — Encounter (HOSPITAL_BASED_OUTPATIENT_CLINIC_OR_DEPARTMENT_OTHER): Payer: Self-pay | Admitting: Orthopaedic Surgery

## 2023-09-21 DIAGNOSIS — E039 Hypothyroidism, unspecified: Secondary | ICD-10-CM | POA: Diagnosis not present

## 2023-09-21 DIAGNOSIS — K449 Diaphragmatic hernia without obstruction or gangrene: Secondary | ICD-10-CM | POA: Insufficient documentation

## 2023-09-21 DIAGNOSIS — S93432A Sprain of tibiofibular ligament of left ankle, initial encounter: Secondary | ICD-10-CM | POA: Insufficient documentation

## 2023-09-21 DIAGNOSIS — X58XXXA Exposure to other specified factors, initial encounter: Secondary | ICD-10-CM | POA: Insufficient documentation

## 2023-09-21 DIAGNOSIS — S82402A Unspecified fracture of shaft of left fibula, initial encounter for closed fracture: Secondary | ICD-10-CM | POA: Diagnosis not present

## 2023-09-21 DIAGNOSIS — G8918 Other acute postprocedural pain: Secondary | ICD-10-CM | POA: Diagnosis not present

## 2023-09-21 DIAGNOSIS — K219 Gastro-esophageal reflux disease without esophagitis: Secondary | ICD-10-CM | POA: Diagnosis not present

## 2023-09-21 DIAGNOSIS — S8262XA Displaced fracture of lateral malleolus of left fibula, initial encounter for closed fracture: Secondary | ICD-10-CM | POA: Diagnosis not present

## 2023-09-21 DIAGNOSIS — S93402A Sprain of unspecified ligament of left ankle, initial encounter: Secondary | ICD-10-CM | POA: Diagnosis not present

## 2023-09-21 DIAGNOSIS — F419 Anxiety disorder, unspecified: Secondary | ICD-10-CM | POA: Diagnosis not present

## 2023-09-21 DIAGNOSIS — S82852A Displaced trimalleolar fracture of left lower leg, initial encounter for closed fracture: Secondary | ICD-10-CM | POA: Diagnosis not present

## 2023-09-21 HISTORY — PX: ORIF ANKLE FRACTURE: SHX5408

## 2023-09-21 HISTORY — PX: SYNDESMOSIS REPAIR: SHX5182

## 2023-09-21 SURGERY — OPEN REDUCTION INTERNAL FIXATION (ORIF) ANKLE FRACTURE
Anesthesia: Regional | Site: Ankle | Laterality: Left

## 2023-09-21 MED ORDER — 0.9 % SODIUM CHLORIDE (POUR BTL) OPTIME
TOPICAL | Status: DC | PRN
  Administered 2023-09-21: 250 mL

## 2023-09-21 MED ORDER — CEFAZOLIN SODIUM-DEXTROSE 2-4 GM/100ML-% IV SOLN
INTRAVENOUS | Status: AC
  Filled 2023-09-21: qty 100

## 2023-09-21 MED ORDER — PROPOFOL 10 MG/ML IV BOLUS
INTRAVENOUS | Status: DC | PRN
Start: 2023-09-21 — End: 2023-09-21
  Administered 2023-09-21: 150 mg via INTRAVENOUS

## 2023-09-21 MED ORDER — ACETAMINOPHEN 500 MG PO TABS
ORAL_TABLET | ORAL | Status: AC
  Filled 2023-09-21: qty 2

## 2023-09-21 MED ORDER — CEFAZOLIN SODIUM-DEXTROSE 2-4 GM/100ML-% IV SOLN: 2.0000 g | INTRAVENOUS | Status: DC

## 2023-09-21 MED ORDER — FENTANYL CITRATE (PF) 100 MCG/2ML IJ SOLN
INTRAMUSCULAR | Status: AC
  Filled 2023-09-21: qty 2

## 2023-09-21 MED ORDER — POVIDONE-IODINE 10 % EX SOLN
CUTANEOUS | Status: DC | PRN
  Administered 2023-09-21: 1 via TOPICAL

## 2023-09-21 MED ORDER — ACETAMINOPHEN 500 MG PO TABS
1000.0000 mg | ORAL_TABLET | Freq: Once | ORAL | Status: AC
  Administered 2023-09-21: 1000 mg via ORAL

## 2023-09-21 MED ORDER — CHLORHEXIDINE GLUCONATE 4 % EX SOLN: 60.0000 mL | Freq: Once | CUTANEOUS | Status: DC

## 2023-09-21 MED ORDER — ONDANSETRON HCL 4 MG/2ML IJ SOLN
INTRAMUSCULAR | Status: AC
  Filled 2023-09-21: qty 2

## 2023-09-21 MED ORDER — CEFAZOLIN SODIUM-DEXTROSE 2-3 GM-%(50ML) IV SOLR
INTRAVENOUS | Status: DC | PRN
  Administered 2023-09-21: 2 g via INTRAVENOUS

## 2023-09-21 MED ORDER — MIDAZOLAM HCL 2 MG/2ML IJ SOLN
INTRAMUSCULAR | Status: AC
  Filled 2023-09-21: qty 2

## 2023-09-21 MED ORDER — ONDANSETRON HCL 4 MG/2ML IJ SOLN
INTRAMUSCULAR | Status: DC | PRN
  Administered 2023-09-21: 4 mg via INTRAVENOUS

## 2023-09-21 MED ORDER — FENTANYL CITRATE (PF) 100 MCG/2ML IJ SOLN
50.0000 ug | Freq: Once | INTRAMUSCULAR | Status: AC
  Administered 2023-09-21: 50 ug via INTRAVENOUS

## 2023-09-21 MED ORDER — FENTANYL CITRATE (PF) 100 MCG/2ML IJ SOLN
INTRAMUSCULAR | Status: DC | PRN
  Administered 2023-09-21 (×2): 50 ug via INTRAVENOUS

## 2023-09-21 MED ORDER — DEXAMETHASONE SODIUM PHOSPHATE 10 MG/ML IJ SOLN
INTRAMUSCULAR | Status: DC | PRN
  Administered 2023-09-21: 5 mg via INTRAVENOUS

## 2023-09-21 MED ORDER — VANCOMYCIN HCL 500 MG IV SOLR
INTRAVENOUS | Status: DC | PRN
  Administered 2023-09-21: 500 mg via TOPICAL

## 2023-09-21 MED ORDER — EPHEDRINE SULFATE (PRESSORS) 50 MG/ML IJ SOLN
INTRAMUSCULAR | Status: DC | PRN
Start: 2023-09-21 — End: 2023-09-21
  Administered 2023-09-21: 10 mg via INTRAVENOUS
  Administered 2023-09-21: 5 mg via INTRAVENOUS

## 2023-09-21 MED ORDER — LACTATED RINGERS IV SOLN: INTRAVENOUS | Status: DC | PRN

## 2023-09-21 MED ORDER — BUPIVACAINE-EPINEPHRINE (PF) 0.5% -1:200000 IJ SOLN
INTRAMUSCULAR | Status: DC | PRN
  Administered 2023-09-21: 30 mL via PERINEURAL

## 2023-09-21 SURGICAL SUPPLY — 64 items
BANDAGE ESMARK 6X9 LF (GAUZE/BANDAGES/DRESSINGS) IMPLANT
BIT DRILL 2.5X2.75 QC CALB (BIT) IMPLANT
BIT DRILL 2.9X70 QC CALB (BIT) IMPLANT
BLADE SURG 15 STRL LF DISP TIS (BLADE) ×4 IMPLANT
BNDG COHESIVE 4X5 TAN STRL LF (GAUZE/BANDAGES/DRESSINGS) ×1 IMPLANT
BNDG ELASTIC 6X10 VLCR STRL LF (GAUZE/BANDAGES/DRESSINGS) ×1 IMPLANT
BNDG ESMARK 6X9 LF (GAUZE/BANDAGES/DRESSINGS) IMPLANT
BNDG GAUZE DERMACEA FLUFF 4 (GAUZE/BANDAGES/DRESSINGS) ×1 IMPLANT
BRUSH SCRUB EZ 4% CHG (MISCELLANEOUS) ×1 IMPLANT
CANISTER SUCT 1200ML W/VALVE (MISCELLANEOUS) ×1 IMPLANT
CHLORAPREP W/TINT 26 (MISCELLANEOUS) ×1 IMPLANT
COVER BACK TABLE 60X90IN (DRAPES) ×1 IMPLANT
CUFF TRNQT CYL 34X4.125X (TOURNIQUET CUFF) ×1 IMPLANT
DRAPE C-ARM 42X72 X-RAY (DRAPES) ×1 IMPLANT
DRAPE C-ARMOR (DRAPES) ×1 IMPLANT
DRAPE EXTREMITY T 121X128X90 (DISPOSABLE) ×1 IMPLANT
DRAPE IMP U-DRAPE 54X76 (DRAPES) ×1 IMPLANT
DRAPE U-SHAPE 47X51 STRL (DRAPES) ×1 IMPLANT
DRSG MEPITEL 4X7.2 (GAUZE/BANDAGES/DRESSINGS) ×1 IMPLANT
ELECT REM PT RETURN 9FT ADLT (ELECTROSURGICAL) ×1 IMPLANT
ELECTRODE REM PT RTRN 9FT ADLT (ELECTROSURGICAL) ×1 IMPLANT
GAUZE PAD ABD 8X10 STRL (GAUZE/BANDAGES/DRESSINGS) ×3 IMPLANT
GAUZE SPONGE 4X4 12PLY STRL (GAUZE/BANDAGES/DRESSINGS) ×1 IMPLANT
GLOVE BIOGEL PI IND STRL 8 (GLOVE) ×1 IMPLANT
GLOVE SURG SS PI 7.5 STRL IVOR (GLOVE) ×2 IMPLANT
GOWN STRL REUS W/ TWL LRG LVL3 (GOWN DISPOSABLE) ×2 IMPLANT
K-WIRE ACE 1.6X6 (WIRE) ×3 IMPLANT
KWIRE ACE 1.6X6 (WIRE) IMPLANT
MARKER SKIN DUAL TIP RULER LAB (MISCELLANEOUS) IMPLANT
NDL HYPO 25X1 1.5 SAFETY (NEEDLE) IMPLANT
NEEDLE HYPO 25X1 1.5 SAFETY (NEEDLE) IMPLANT
NS IRRIG 1000ML POUR BTL (IV SOLUTION) ×1 IMPLANT
PACK BASIN DAY SURGERY FS (CUSTOM PROCEDURE TRAY) ×1 IMPLANT
PAD CAST 4YDX4 CTTN HI CHSV (CAST SUPPLIES) ×1 IMPLANT
PADDING CAST ABS COTTON 4X4 ST (CAST SUPPLIES) IMPLANT
PADDING CAST SYNTHETIC 4X4 STR (CAST SUPPLIES) ×2 IMPLANT
PADDING CAST SYNTHETIC 6X4 NS (CAST SUPPLIES) ×2 IMPLANT
PENCIL SMOKE EVACUATOR (MISCELLANEOUS) ×1 IMPLANT
PLATE LOCK 7H 92 BILAT FIB (Plate) IMPLANT
SCREW LAG CANC FT 4X46 (Screw) IMPLANT
SCREW LOCK CORT STAR 3.5X12 (Screw) IMPLANT
SCREW LOW PROFILE 12MMX3.5MM (Screw) IMPLANT
SCREW NON LOCKING LP 3.5 14MM (Screw) IMPLANT
SHEET MEDIUM DRAPE 40X70 STRL (DRAPES) ×1 IMPLANT
SLEEVE SCD COMPRESS KNEE MED (STOCKING) ×1 IMPLANT
SPIKE FLUID TRANSFER (MISCELLANEOUS) IMPLANT
SPONGE T-LAP 18X18 ~~LOC~~+RFID (SPONGE) ×1 IMPLANT
STAPLER SKIN PROX 35W (STAPLE) IMPLANT
STAPLER SKIN PROX WIDE 3.9 (STAPLE) IMPLANT
STOCKINETTE 6 STRL (DRAPES) ×1 IMPLANT
STOCKINETTE ORTHO 6X25 (MISCELLANEOUS) ×1 IMPLANT
SUCTION TUBE FRAZIER 10FR DISP (SUCTIONS) IMPLANT
SUT ETHILON 2 0 FS 18 (SUTURE) ×2 IMPLANT
SUT MNCRL AB 3-0 PS2 18 (SUTURE) IMPLANT
SUT PDS 3-0 CT2 (SUTURE) IMPLANT
SUT PDS II 3-0 CT2 27 ABS (SUTURE) IMPLANT
SUT VIC AB 0 CT1 27XBRD ANBCTR (SUTURE) IMPLANT
SUT VIC AB 2-0 CT1 TAPERPNT 27 (SUTURE) ×1 IMPLANT
SUT VIC AB 3-0 SH 27X BRD (SUTURE) IMPLANT
SYR BULB IRRIG 60ML STRL (SYRINGE) ×1 IMPLANT
SYR CONTROL 10ML LL (SYRINGE) IMPLANT
TOWEL GREEN STERILE FF (TOWEL DISPOSABLE) ×2 IMPLANT
TUBE CONNECTING 20X1/4 (TUBING) ×1 IMPLANT
UNDERPAD 30X36 HEAVY ABSORB (UNDERPADS AND DIAPERS) ×1 IMPLANT

## 2023-09-21 NOTE — Anesthesia Preprocedure Evaluation (Addendum)
 Anesthesia Evaluation  Patient identified by MRN, date of birth, ID band Patient awake    Reviewed: Allergy & Precautions, NPO status , Patient's Chart, lab work & pertinent test results  Airway Mallampati: I  TM Distance: >3 FB Neck ROM: Full    Dental no notable dental hx.    Pulmonary neg pulmonary ROS   Pulmonary exam normal        Cardiovascular negative cardio ROS Normal cardiovascular exam     Neuro/Psych   Anxiety      Neuromuscular disease    GI/Hepatic Neg liver ROS, hiatal hernia,GERD  Medicated and Controlled,,  Endo/Other  Hypothyroidism    Renal/GU negative Renal ROS     Musculoskeletal  (+) Arthritis ,    Abdominal   Peds  Hematology negative hematology ROS (+)   Anesthesia Other Findings Closed fracture of lateral malleolus of left fibula  Reproductive/Obstetrics                             Anesthesia Physical Anesthesia Plan  ASA: 3  Anesthesia Plan: Regional and General   Post-op Pain Management: Regional block*   Induction:   PONV Risk Score and Plan: 3 and Ondansetron, Dexamethasone and Treatment Chanele vary due to age or medical condition  Airway Management Planned: LMA  Additional Equipment:   Intra-op Plan:   Post-operative Plan: Extubation in OR  Informed Consent: I have reviewed the patients History and Physical, chart, labs and discussed the procedure including the risks, benefits and alternatives for the proposed anesthesia with the patient or authorized representative who has indicated his/her understanding and acceptance.     Dental advisory given  Plan Discussed with: CRNA  Anesthesia Plan Comments:        Anesthesia Quick Evaluation

## 2023-09-21 NOTE — Anesthesia Procedure Notes (Signed)
 Procedure Name: LMA Insertion Date/Time: 09/21/2023 1:33 PM  Performed by: Karen Kitchens, CRNAPre-anesthesia Checklist: Patient identified, Emergency Drugs available, Suction available and Patient being monitored Patient Re-evaluated:Patient Re-evaluated prior to induction Oxygen Delivery Method: Circle system utilized Preoxygenation: Pre-oxygenation with 100% oxygen Induction Type: IV induction Ventilation: Mask ventilation without difficulty LMA: LMA inserted LMA Size: 3.0 Number of attempts: 1 Airway Equipment and Method: Bite block Placement Confirmation: positive ETCO2, CO2 detector and breath sounds checked- equal and bilateral Tube secured with: Tape Dental Injury: Teeth and Oropharynx as per pre-operative assessment

## 2023-09-21 NOTE — Anesthesia Postprocedure Evaluation (Signed)
 Anesthesia Post Note  Patient: Amy Krause  Procedure(s) Performed: OPEN REDUCTION INTERNAL FIXATION (ORIF) LATERAL MALLEOLUS ANKLE FRACTURE (Left: Ankle) POSSIBLE SYNDESMOSIS REPAIR (Left: Ankle)     Patient location during evaluation: PACU Anesthesia Type: Regional and General Level of consciousness: awake Pain management: pain level controlled Vital Signs Assessment: post-procedure vital signs reviewed and stable Respiratory status: spontaneous breathing, nonlabored ventilation and respiratory function stable Cardiovascular status: blood pressure returned to baseline and stable Postop Assessment: no apparent nausea or vomiting Anesthetic complications: no   No notable events documented.  Last Vitals:  Vitals:   09/21/23 1430 09/21/23 1445  BP: 130/75 127/71  Pulse: 70 69  Resp: 13 14  Temp:    SpO2: 98% 99%    Last Pain:  Vitals:   09/21/23 1445  TempSrc:   PainSc: 0-No pain                 Joslynne Klatt P Shantika Bermea

## 2023-09-21 NOTE — Transfer of Care (Signed)
 Immediate Anesthesia Transfer of Care Note  Patient: Amy Krause  Procedure(s) Performed: OPEN REDUCTION INTERNAL FIXATION (ORIF) LATERAL MALLEOLUS ANKLE FRACTURE (Left: Ankle) POSSIBLE SYNDESMOSIS REPAIR (Left: Ankle)  Patient Location: PACU  Anesthesia Type:General and Regional  Level of Consciousness: awake, alert , and patient cooperative  Airway & Oxygen Therapy: Patient Spontanous Breathing and Patient connected to face mask oxygen  Post-op Assessment: Report given to RN and Post -op Vital signs reviewed and stable  Post vital signs: Reviewed and stable  Last Vitals:  Vitals Value Taken Time  BP 129/73 09/21/23 1400  Temp    Pulse 92 09/21/23 1402  Resp 6 09/21/23 1402  SpO2 99 % 09/21/23 1402  Vitals shown include unfiled device data.  Last Pain:  Vitals:   09/21/23 1022  TempSrc: Temporal  PainSc: 0-No pain         Complications: No notable events documented.

## 2023-09-21 NOTE — Progress Notes (Signed)
Assisted Dr. Ellender with left, popliteal, ultrasound guided block. Side rails up, monitors on throughout procedure. See vital signs in flow sheet. Tolerated Procedure well. 

## 2023-09-21 NOTE — Anesthesia Procedure Notes (Signed)
 Anesthesia Regional Block: Popliteal block   Pre-Anesthetic Checklist: , timeout performed,  Correct Patient, Correct Site, Correct Laterality,  Correct Procedure, Correct Position, site marked,  Risks and benefits discussed,  Surgical consent,  Pre-op evaluation,  At surgeon's request and post-op pain management  Laterality: Left  Prep: chloraprep       Needles:  Injection technique: Single-shot  Needle Type: Echogenic Stimulator Needle     Needle Length: 10cm  Needle Gauge: 20     Additional Needles:   Procedures:,,,, ultrasound used (permanent image in chart),,    Narrative:  Start time: 09/21/2023 11:05 AM End time: 09/21/2023 11:15 AM Injection made incrementally with aspirations every 5 mL.  Performed by: Personally  Anesthesiologist: Leonides Grills, MD  Additional Notes: Functioning IV was confirmed and monitors were applied.  A timeout was performed. Sterile prep, hand hygiene and sterile gloves were used. A 20ga Bbraun echogenic stimulator needle was used. Negative aspiration and negative test dose prior to incremental administration of local anesthetic. The patient tolerated the procedure well.  Ultrasound guidance: relevent anatomy identified, needle position confirmed, local anesthetic spread visualized around nerve(s), vascular puncture avoided.  Image printed for medical record.

## 2023-09-21 NOTE — H&P (Signed)

## 2023-09-22 NOTE — Op Note (Signed)
 09/21/2023  8:56 PM   PATIENT: Amy Krause  83 y.o. female  MRN: [id number]   PRE-OPERATIVE DIAGNOSIS:   Closed displaced trimalleolar left ankle fracture   POST-OPERATIVE DIAGNOSIS:   Same   PROCEDURE: 1] ORIF left trimalleolar ankle fracture without fixation of posterior malleolus 2] ORIF left ankle syndesmosis   SURGEON:  Netta Cedars, MD   ASSISTANT: None   ANESTHESIA: General, regional   EBL: Minimal   TOURNIQUET:    Total Tourniquet Time Documented: Thigh (laterality) - 22 minutes Total: Thigh (laterality) - 22 minutes    COMPLICATIONS: None apparent   DISPOSITION: Extubated, awake and stable to recovery.   INDICATION FOR PROCEDURE: The patient presented with above diagnosis.  We discussed the diagnosis, alternative treatment options, risks and benefits of the above surgical intervention, as well as alternative non-operative treatments. All questions/concerns were addressed and the patient/family demonstrated appropriate understanding of the diagnosis, the procedure, the postoperative course, and overall prognosis. The patient wished to proceed with surgical intervention and signed an informed surgical consent as such, in each others presence prior to surgery.   PROCEDURE IN DETAIL: After preoperative consent was obtained and the correct operative site was identified, the patient was brought to the operating room supine on stretcher and transferred onto operating table. General anesthesia was induced. Preoperative antibiotics were administered. Surgical timeout was taken. The patient was then positioned supine with an ipsilateral hip bump. The operative lower extremity was prepped and draped in standard sterile fashion with a tourniquet around the thigh. The extremity was exsanguinated and the tourniquet was inflated to 275 mmHg.  A standard lateral incision was made over the distal fibula. Dissection was carried down to the level of the fibula  and the fracture site identified. The superficial peroneal nerve was identified and protected throughout the procedure. The fibula was noted to be shortened with interposed periosteum. The fibula was brought out to length. The fibula fracture was debrided and the edges defined to achieve cortical read. Reduction maneuver was performed using pointed reduction forceps and lobster forceps. In this manner, the fibula length was restored and fracture reduced. A lag screw was not placed given the orientation of fracture lines and comminution. Due to poor bone quality and extensive comminution at the fracture site, it was decided to use a locking distal fibula plate. We then selected a Zimmer locking plate to match the anatomy of the distal fibula and placed it laterally. This was implanted under intraoperative fluoroscopy with a combination of distal locking screws and proximal cortical & locking screws.  The anterolateral distal tibia fragment was then approached via dissection. It was directly open reduced as confirmed by visualization and intraoperative fluoroscopy. This fracture was deemed to small to receive internal fixation and therefore left in its anatomic position.  The posterior malleolar fragment was indirectly reduced as confirmed by intraoperative fluoroscopy. This fracture was deemed to small to receive internal fixation and therefore left in its anatomic position.  A manual external rotation stress radiograph was obtained and demonstrated widening of the ankle mortise. Given this intraoperative finding as well as preoperative subluxation, it was decided to reduce and fix the syndesmosis. Therefore a nonlocking quadricortical hex head 4.0 mm screw was implanted through the fibula plate in appropriate fashion to fix the syndesmosis. Screw position was verified along anteromedial tibial cortex by fluoroscopy. A repeat stress radiograph showed complete stability of the ankle mortise to testing.   The  surgical sites were thoroughly irrigated. The tourniquet was deflated  and hemostasis achieved. Betadine and vancomycin powder were applied. The deep layers were closed using 2-0 vicryl. The skin was closed without tension.    The leg was cleaned with saline and sterile dressings with gauze were applied. A well padded bulky short leg splint was applied. The patient was awakened from anesthesia and transported to the recovery room in stable condition.    FOLLOW UP PLAN: -transfer to PACU, then home -strict NWB operative extremity, maximum elevation -maintain short leg splint until follow up -DVT ppx: Aspirin 81 mg twice daily while NWB -follow up as outpatient within 7-10 days for wound check with exchange of short leg splint to short leg cast -sutures out in 2-3 weeks in outpatient office   RADIOGRAPHS: AP, lateral, oblique and stress radiographs of the left ankle were obtained intraoperatively. These showed interval reduction and fixation of the fractures. Manual stress radiographs were taken and the joints were noted to be stable following fixation. All hardware is appropriately positioned and of the appropriate lengths. No other acute injuries are noted.   Netta Cedars Orthopaedic Surgery [facility]

## 2023-09-23 ENCOUNTER — Encounter (HOSPITAL_BASED_OUTPATIENT_CLINIC_OR_DEPARTMENT_OTHER): Payer: Self-pay | Admitting: Orthopaedic Surgery

## 2023-09-29 DIAGNOSIS — J301 Allergic rhinitis due to pollen: Secondary | ICD-10-CM | POA: Diagnosis not present

## 2023-09-29 DIAGNOSIS — J3081 Allergic rhinitis due to animal (cat) (dog) hair and dander: Secondary | ICD-10-CM | POA: Diagnosis not present

## 2023-09-29 DIAGNOSIS — J3089 Other allergic rhinitis: Secondary | ICD-10-CM | POA: Diagnosis not present

## 2023-09-29 DIAGNOSIS — S8262XD Displaced fracture of lateral malleolus of left fibula, subsequent encounter for closed fracture with routine healing: Secondary | ICD-10-CM | POA: Diagnosis not present

## 2023-10-07 ENCOUNTER — Non-Acute Institutional Stay (SKILLED_NURSING_FACILITY): Payer: Self-pay | Admitting: Orthopedic Surgery

## 2023-10-07 ENCOUNTER — Encounter: Payer: Self-pay | Admitting: Orthopedic Surgery

## 2023-10-07 DIAGNOSIS — M810 Age-related osteoporosis without current pathological fracture: Secondary | ICD-10-CM | POA: Diagnosis not present

## 2023-10-07 DIAGNOSIS — F339 Major depressive disorder, recurrent, unspecified: Secondary | ICD-10-CM

## 2023-10-07 DIAGNOSIS — L299 Pruritus, unspecified: Secondary | ICD-10-CM

## 2023-10-07 DIAGNOSIS — B372 Candidiasis of skin and nail: Secondary | ICD-10-CM | POA: Diagnosis not present

## 2023-10-07 DIAGNOSIS — I10 Essential (primary) hypertension: Secondary | ICD-10-CM

## 2023-10-07 DIAGNOSIS — E039 Hypothyroidism, unspecified: Secondary | ICD-10-CM | POA: Diagnosis not present

## 2023-10-07 DIAGNOSIS — S82892D Other fracture of left lower leg, subsequent encounter for closed fracture with routine healing: Secondary | ICD-10-CM | POA: Diagnosis not present

## 2023-10-07 DIAGNOSIS — K219 Gastro-esophageal reflux disease without esophagitis: Secondary | ICD-10-CM | POA: Diagnosis not present

## 2023-10-07 NOTE — Progress Notes (Unsigned)
 Location:  [facility] Room Number: 35/A Place of Service:  SNF (31) Provider:  Octavia Heir, NP   Thana Ates, MD  Patient Care Team: Thana Ates, MD as PCP - General (Internal Medicine)  Extended Emergency Contact Information Primary Emergency Contact: Carmelia Bake Address: [address]          [place] [address] [place] Mobile Phone: [phone] Relation: Spouse  Code Status:  Full code Goals of care: Advanced Directive information    09/21/2023   10:19 AM  Advanced Directives  Does Patient Have a Medical Advance Directive? Yes  Type of Estate agent of [place];Living will  Does patient want to make changes to medical advance directive? No - Patient declined  Copy of Healthcare Power of Attorney in Chart? No - copy requested     Chief Complaint  Patient presents with   Acute Visit    Left ankle pain    HPI:  Pt is a 83 y.o. female seen today for acute visit due to left ankle pain.   She was admitted to [facility] 03/13. PMH: HTN, hypothyroidism, CKD, GERD, osteoporosis, h/o right breast cancer s/p lumpectomy, depression and anxiety.   02/14 mechanical fall. She continues to have left ankle pain after event. 02/17 xray left ankle showed mildly displaced fracture of distal fibular shaft, no joint dislocation. 02/26 she underwent ORIF lateral malleolus ankle. She tolerated procedure well and was discharged home. Cast was placed after procedure.    Past Medical History:  Diagnosis Date   Anxiety    Takes Zoloft, follows w/ PCP Dr. Kirby Funk at [facility].   Arthritis    knees   Cancer (HCC) 1991   right breast cancer, s/p chemotherapy and radiation   Dupuytren's contracture    left little finger   GERD (gastroesophageal reflux disease)    Follows with [facility], takes Omeprazole.   History of hiatal hernia    Hypothyroidism    Follows w/ PCP Dr.  Kirby Funk at  [facility].   Lupus    cutaneous, very mild   Multiple allergies    Takes weekly allergy shots as of 03/26/22.   Wears glasses    Wears hearing aid    right ear   Past Surgical History:  Procedure Laterality Date   BREAST LUMPECTOMY Right 1991   COLONOSCOPY     hx of several   CYSTOCELE REPAIR N/A 04/12/2022   Procedure: ANTERIOR REPAIR (CYSTOCELE);  Surgeon: Marcelle Overlie, MD;  Location: [facility];  Service: Gynecology;  Laterality: N/A;   DILATION AND CURETTAGE OF UTERUS  2011   MASTOIDECTOMY Bilateral    1991 and 2002   ORIF ANKLE FRACTURE Left 09/21/2023   Procedure: OPEN REDUCTION INTERNAL FIXATION (ORIF) LATERAL MALLEOLUS ANKLE FRACTURE;  Surgeon: Netta Cedars, MD;  Location: [facility];  Service: Orthopedics;  Laterality: Left;  GENERAL AND BLOCK   RECTOCELE REPAIR N/A 04/12/2022   Procedure: POSTERIOR REPAIR (RECTOCELE);  Surgeon: Marcelle Overlie, MD;  Location: [facility];  Service: Gynecology;  Laterality: N/A;   SYNDESMOSIS REPAIR Left 09/21/2023   Procedure: POSSIBLE SYNDESMOSIS REPAIR;  Surgeon: Netta Cedars, MD;  Location: [facility];  Service: Orthopedics;  Laterality: Left;   TONSILLECTOMY     as a child   TUBAL LIGATION  1974   TYMPANOMASTOIDECTOMY Right 05/08/2013   Procedure: RIGHT  CANAL-UP TYMPANOMASTOIDECTOMY; MICRODISSECTION USING THE OR MICROSCOPE; PLACEMENT  OF PAPARELLA TUBE IN RIGHT EAR ;  Surgeon: Carolan Shiver, MD;  Location: [facility];  Service: ENT;  Laterality: Right;   TYMPANOSTOMY TUBE PLACEMENT     multiple times per pt   VAGINAL HYSTERECTOMY Bilateral 04/12/2022   Procedure: TOTAL VAGINAL HYSTERECTOMY;  Surgeon: Marcelle Overlie, MD;  Location: [facility];  Service: Gynecology;  Laterality: Bilateral;    Allergies  Allergen Reactions   Sulfa Antibiotics     Mouth blisters   Tape     Blisters skin    Outpatient Encounter Medications  as of 10/07/2023  Medication Sig   amLODipine (NORVASC) 2.5 MG tablet Take 2.5 mg by mouth daily.   aspirin EC 81 MG tablet Take 81 mg by mouth daily. Swallow whole.   calcium-vitamin D (OSCAL WITH D) 500-200 MG-UNIT per tablet Take 1 tablet by mouth daily with breakfast.   Cholecalciferol (VITAMIN D3) 25 MCG (1000 UT) CAPS Take by mouth. 3 tablets daily   clobetasol cream (TEMOVATE) 0.05 % Apply topically as needed.   EPINEPHrine 0.3 mg/0.3 mL IJ SOAJ injection INJECT INTRAMUSCULARLY AS DIRECTED   levothyroxine (SYNTHROID, LEVOTHROID) 75 MCG tablet Take 75 mcg by mouth daily before breakfast.   montelukast (SINGULAIR) 10 MG tablet Take 10 mg by mouth at bedtime.   Multiple Vitamins-Minerals (MULTIVITAMIN WITH MINERALS) tablet Take 1 tablet by mouth daily.   omeprazole (PRILOSEC) 40 MG capsule Take 40 mg by mouth daily.   polyethylene glycol powder (GLYCOLAX/MIRALAX) 17 GM/SCOOP powder Take by mouth as needed.   Propylene Glycol (SYSTANE COMPLETE OP) Apply to eye as needed.   raloxifene (EVISTA) 60 MG tablet Take 60 mg by mouth every evening.   sertraline (ZOLOFT) 50 MG tablet Take 50 mg by mouth daily.   No facility-administered encounter medications on file as of 10/07/2023.    Review of Systems  Immunization History  Administered Date(s) Administered   PFIZER(Purple Top)SARS-COV-2 Vaccination 08/10/2019, 08/31/2019   Zoster Recombinant(Shingrix) 07/21/2018, 02/28/2019   Pertinent  Health Maintenance Due  Topic Date Due   INFLUENZA VACCINE  Completed   DEXA SCAN  Completed       No data to display         Functional Status Survey:    Vitals:   10/07/23 1317  BP: 127/80  Pulse: 77  Resp: 18  Temp: (!) 97 F (36.1 C)  SpO2: 97%  Weight: 147 lb 4.8 oz (66.8 kg)  Height: 4\' 10"  (1.473 m)   Body mass index is 30.79 kg/m. Physical Exam  Labs reviewed: No results for input(s): "NA", "K", "CL", "CO2", "GLUCOSE", "BUN", "CREATININE", "CALCIUM", "MG", "PHOS" in the last  8760 hours. No results for input(s): "AST", "ALT", "ALKPHOS", "BILITOT", "PROT", "ALBUMIN" in the last 8760 hours. No results for input(s): "WBC", "NEUTROABS", "HGB", "HCT", "MCV", "PLT" in the last 8760 hours. No results found for: "TSH" No results found for: "HGBA1C" No results found for: "CHOL", "HDL", "LDLCALC", "LDLDIRECT", "TRIG", "CHOLHDL"  Significant Diagnostic Results in last 30 days:  DG C-Arm 1-60 Min-No Report Result Date: 09/21/2023 Fluoroscopy was utilized by the requesting physician.  No radiographic interpretation.   CT ANKLE LEFT WO CONTRAST Result Date: 09/16/2023 CLINICAL DATA:  Status post fall and left ankle 09/09/2023. Evaluate for fracture. EXAM: CT OF THE LEFT ANKLE WITHOUT CONTRAST TECHNIQUE: Multidetector CT imaging of the left ankle was performed according to the standard protocol. Multiplanar CT image reconstructions were also generated. RADIATION DOSE REDUCTION: This exam was performed according to the departmental  dose-optimization program which includes automated exposure control, adjustment of the mA and/or kV according to patient size and/or use of iterative reconstruction technique. COMPARISON:  Left foot radiographs 07/28/2009 FINDINGS: Bones/Joint/Cartilage There is an acute to subacute, oblique fracture within the distal femoral diaphysis extending into the proximal metadiaphysis in a superolateral to inferior medial orientation. There is up to 2 mm lateral displacement of the distal fracture component with respect to the proximal fracture component. There is low-density mineralization bordering the anterior aspect of the distal femoral metadiaphysis (axial images 48 through 55 and sagittal images 45 through 48). This actually appears to represent a small, superficial cortical fracture at the distal anterolateral aspect of the tibia (axial series 3 images 46 through 55). This bone mineralization measures up to 7 x 2 x 21 mm (transverse by AP by craniocaudal). There  appears to be a donor site within the adjacent talus. Mild partially visualized lateral great toe metatarsal head degenerative spurring. Small plantar and minimal posterior calcaneal heel spurs. Chronic 4 mm well corticated ossicle lateral to the distal medial malleolus at the medial ankle clear space (coronal series 11, image 36). There is mild degenerative spurring at the articulation of the dorsal aspect of the distal cuboid-lateral cuneiform (axial series 3, images 76 and 77, coronal images 19 and 20 Ligaments Suboptimally assessed by CT. Muscles and Tendons Normal size and density of the regional musculature. Possible mild "chevron configuration" of the peroneus brevis tendon starting at the distal aspect of the fibula, a possible longitudinal partial-thickness tear. Soft tissues Mild-to-moderate lateral greater than medial ankle and hindfoot subcutaneous fat edema and swelling. IMPRESSION: 1. Acute to subacute, oblique fracture within the distal femoral diaphysis extending into the proximal metadiaphysis with up to 2 mm lateral displacement of the distal fracture component with respect to the proximal fracture component. 2. Small, superficial cortical fracture at the distal anterolateral aspect of the tibia. There appears to be a shallow donor site within the adjacent talus. 3. Possible mild longitudinal partial-thickness tear of the peroneus brevis tendon starting at the distal aspect of the fibula. 4. Mild-to-moderate lateral greater than medial ankle and hindfoot subcutaneous fat edema and swelling. Electronically Signed   By: Neita Garnet M.D.   On: 09/16/2023 15:52    Assessment/Plan There are no diagnoses linked to this encounter.   Family/ staff Communication: ***  Labs/tests ordered:  ***

## 2023-10-10 DIAGNOSIS — M84372D Stress fracture, left ankle, subsequent encounter for fracture with routine healing: Secondary | ICD-10-CM | POA: Diagnosis not present

## 2023-10-10 DIAGNOSIS — S82892A Other fracture of left lower leg, initial encounter for closed fracture: Secondary | ICD-10-CM | POA: Insufficient documentation

## 2023-10-10 DIAGNOSIS — K219 Gastro-esophageal reflux disease without esophagitis: Secondary | ICD-10-CM | POA: Insufficient documentation

## 2023-10-10 DIAGNOSIS — F339 Major depressive disorder, recurrent, unspecified: Secondary | ICD-10-CM | POA: Insufficient documentation

## 2023-10-10 DIAGNOSIS — I10 Essential (primary) hypertension: Secondary | ICD-10-CM | POA: Insufficient documentation

## 2023-10-10 DIAGNOSIS — M810 Age-related osteoporosis without current pathological fracture: Secondary | ICD-10-CM | POA: Insufficient documentation

## 2023-10-10 DIAGNOSIS — Z9181 History of falling: Secondary | ICD-10-CM | POA: Diagnosis not present

## 2023-10-10 DIAGNOSIS — E039 Hypothyroidism, unspecified: Secondary | ICD-10-CM | POA: Insufficient documentation

## 2023-10-10 DIAGNOSIS — M6281 Muscle weakness (generalized): Secondary | ICD-10-CM | POA: Diagnosis not present

## 2023-10-10 DIAGNOSIS — R278 Other lack of coordination: Secondary | ICD-10-CM | POA: Diagnosis not present

## 2023-10-10 MED ORDER — HYDROXYZINE HCL 10 MG PO TABS: 10.0000 mg | ORAL_TABLET | Freq: Two times a day (BID) | ORAL | Status: DC | PRN

## 2023-10-10 MED ORDER — NYSTATIN 100000 UNIT/GM EX CREA: 1.0000 | TOPICAL_CREAM | Freq: Two times a day (BID) | CUTANEOUS | Status: AC

## 2023-10-12 DIAGNOSIS — M6281 Muscle weakness (generalized): Secondary | ICD-10-CM | POA: Diagnosis not present

## 2023-10-12 DIAGNOSIS — R278 Other lack of coordination: Secondary | ICD-10-CM | POA: Diagnosis not present

## 2023-10-12 DIAGNOSIS — M84372D Stress fracture, left ankle, subsequent encounter for fracture with routine healing: Secondary | ICD-10-CM | POA: Diagnosis not present

## 2023-10-12 DIAGNOSIS — Z9181 History of falling: Secondary | ICD-10-CM | POA: Diagnosis not present

## 2023-10-13 ENCOUNTER — Non-Acute Institutional Stay (SKILLED_NURSING_FACILITY): Payer: Self-pay | Admitting: Internal Medicine

## 2023-10-13 DIAGNOSIS — L299 Pruritus, unspecified: Secondary | ICD-10-CM

## 2023-10-13 DIAGNOSIS — M810 Age-related osteoporosis without current pathological fracture: Secondary | ICD-10-CM

## 2023-10-13 DIAGNOSIS — F339 Major depressive disorder, recurrent, unspecified: Secondary | ICD-10-CM

## 2023-10-13 DIAGNOSIS — Z8781 Personal history of (healed) traumatic fracture: Secondary | ICD-10-CM

## 2023-10-13 DIAGNOSIS — I10 Essential (primary) hypertension: Secondary | ICD-10-CM | POA: Diagnosis not present

## 2023-10-13 DIAGNOSIS — Z9889 Other specified postprocedural states: Secondary | ICD-10-CM

## 2023-10-13 DIAGNOSIS — K219 Gastro-esophageal reflux disease without esophagitis: Secondary | ICD-10-CM

## 2023-10-13 DIAGNOSIS — Z9181 History of falling: Secondary | ICD-10-CM | POA: Diagnosis not present

## 2023-10-13 DIAGNOSIS — M6281 Muscle weakness (generalized): Secondary | ICD-10-CM | POA: Diagnosis not present

## 2023-10-13 DIAGNOSIS — M84372D Stress fracture, left ankle, subsequent encounter for fracture with routine healing: Secondary | ICD-10-CM | POA: Diagnosis not present

## 2023-10-13 DIAGNOSIS — E039 Hypothyroidism, unspecified: Secondary | ICD-10-CM

## 2023-10-13 NOTE — Progress Notes (Unsigned)
 Provider:   Location:  [facility] cur[facility] of Service:  SNF (31)  PCP: Thana Ates, MD Patient Care Team: Thana Ates, MD as PCP - General (Internal Medicine)  Extended Emergency Contact Information Primary Emergency Contact: Tieszen,[name] Address: [address]          [place] [address] [place] Mobile Phone: [phone] Relation: Spouse  Code Status:  Goals of Care: Advanced Directive information    09/21/2023   10:19 AM  Advanced Directives  Does Patient Have a Medical Advance Directive? Yes  Type of Estate agent of [place];Living will  Does patient want to make changes to medical advance directive? No - Patient declined  Copy of Healthcare Power of Attorney in Chart? No - copy requested      Chief Complaint  Patient presents with   New Admit To SNF    HPI: Patient is a 83 y.o. female seen today for admission to SNF for Rehab Patient had a mechanical fall on 02/17.  It showed mildly displaced fracture of Left distal fibular.  She underwent ORIF lateral malleolus ankle on 02/26.  She is nonweightbearing for 6 weeks.  Patient also has a history of hypertension, CKD, GERD and osteoporosis, depression and anxiety  When patient was admitted she had some itching but it got resolved with Vistaril.  Otherwise she continues to do well and is not having any acute issues.  She does not have any issues with pain Past Medical History:  Diagnosis Date   Anxiety    Takes Zoloft, follows w/ PCP Dr. Kirby Funk at [facility].   Arthritis    knees   Cancer (HCC) 1991   right breast cancer, s/p chemotherapy and radiation   Dupuytren's contracture    left little finger   GERD (gastroesophageal reflux disease)    Follows with [facility], takes Omeprazole.   History of hiatal hernia    Hypothyroidism    Follows w/ PCP Dr.  Kirby Funk at [facility].   Lupus    cutaneous, very mild   Multiple allergies     Takes weekly allergy shots as of 03/26/22.   Wears glasses    Wears hearing aid    right ear   Past Surgical History:  Procedure Laterality Date   BREAST LUMPECTOMY Right 1991   COLONOSCOPY     hx of several   CYSTOCELE REPAIR N/A 04/12/2022   Procedure: ANTERIOR REPAIR (CYSTOCELE);  Surgeon: Marcelle Overlie, MD;  Location: [facility];  Service: Gynecology;  Laterality: N/A;   DILATION AND CURETTAGE OF UTERUS  2011   MASTOIDECTOMY Bilateral    1991 and 2002   ORIF ANKLE FRACTURE Left 09/21/2023   Procedure: OPEN REDUCTION INTERNAL FIXATION (ORIF) LATERAL MALLEOLUS ANKLE FRACTURE;  Surgeon: Netta Cedars, MD;  Location: [facility];  Service: Orthopedics;  Laterality: Left;  GENERAL AND BLOCK   RECTOCELE REPAIR N/A 04/12/2022   Procedure: POSTERIOR REPAIR (RECTOCELE);  Surgeon: Marcelle Overlie, MD;  Location: [facility];  Service: Gynecology;  Laterality: N/A;   SYNDESMOSIS REPAIR Left 09/21/2023   Procedure: POSSIBLE SYNDESMOSIS REPAIR;  Surgeon: Netta Cedars, MD;  Location: [facility];  Service: Orthopedics;  Laterality: Left;   TONSILLECTOMY     as a child   TUBAL LIGATION  1974   TYMPANOMASTOIDECTOMY Right 05/08/2013   Procedure: RIGHT  CANAL-UP TYMPANOMASTOIDECTOMY; MICRODISSECTION USING THE OR MICROSCOPE; PLACEMENT OF PAPARELLA TUBE IN RIGHT EAR ;  Surgeon: Carolan Shiver, MD;  Location: [facility];  Service: ENT;  Laterality: Right;   TYMPANOSTOMY TUBE PLACEMENT     multiple times per pt   VAGINAL HYSTERECTOMY Bilateral 04/12/2022   Procedure: TOTAL VAGINAL HYSTERECTOMY;  Surgeon: Marcelle Overlie, MD;  Location: [facility];  Service: Gynecology;  Laterality: Bilateral;    reports that she has never smoked. She has never used smokeless tobacco. She reports that she does not drink alcohol and does not use drugs. Social History   Socioeconomic History   Marital status: Married     Spouse name: Arville Care   Number of children: Not on file   Years of education: Not on file   Highest education level: Not on file  Occupational History   Not on file  Tobacco Use   Smoking status: Never   Smokeless tobacco: Never  Vaping Use   Vaping status: Never Used  Substance and Sexual Activity   Alcohol use: No   Drug use: Never   Sexual activity: Not on file  Other Topics Concern   Not on file  Social History Narrative   Not on file   Social Drivers of Health   Financial Resource Strain: Not on file  Food Insecurity: Low Risk  (04/15/2023)   Received from [facility]   Hunger Vital Sign    Worried About Running Out of Food in the Last Year: Never true    Ran Out of Food in the Last Year: Never true  Transportation Needs: No Transportation Needs (04/15/2023)   Received from [facility]    In the past 12 months, has lack of reliable transportation kept you from medical appointments, meetings, work or from getting things needed for daily living? : No  Physical Activity: Not on file  Stress: Not on file  Social Connections: Not on file  Intimate Partner Violence: Not on file    Functional Status Survey:    No family history on file.  Health Maintenance  Topic Date Due   DTaP/Tdap/Td (1 - Tdap) Never done   Pneumonia Vaccine 50+ Years old (1 of 1 - PCV) Never done   COVID-19 Vaccine (6 - Mixed Product risk 2024-25 season) 11/27/2023   Medicare Annual Wellness (AWV)  07/31/2024   INFLUENZA VACCINE  Completed   DEXA SCAN  Completed   Zoster Vaccines- Shingrix  Completed   HPV VACCINES  Aged Out    Allergies  Allergen Reactions   Sulfa Antibiotics     Mouth blisters   Tape     Blisters skin    Outpatient Encounter Medications as of 10/13/2023  Medication Sig   amLODipine (NORVASC) 2.5 MG tablet Take 2.5 mg by mouth daily.   aspirin EC 81 MG tablet Take 81 mg by mouth 2 times daily at 12 noon and 4 pm. Swallow whole.   calcium-vitamin D  (OSCAL WITH D) 500-200 MG-UNIT per tablet Take 1 tablet by mouth daily with breakfast.   Cholecalciferol (VITAMIN D3) 25 MCG (1000 UT) CAPS Take by mouth. 3 tablets daily   clobetasol cream (TEMOVATE) 0.05 % Apply topically as needed.   EPINEPHrine 0.3 mg/0.3 mL IJ SOAJ injection INJECT INTRAMUSCULARLY AS DIRECTED   hydrOXYzine (ATARAX) 10 MG tablet Take 1 tablet (10 mg total) by mouth 2 (two) times daily as needed.   levothyroxine (SYNTHROID, LEVOTHROID) 75 MCG tablet Take 75 mcg by mouth daily before breakfast.   montelukast (SINGULAIR) 10 MG tablet Take 10 mg by  mouth at bedtime.   Multiple Vitamins-Minerals (MULTIVITAMIN WITH MINERALS) tablet Take 1 tablet by mouth daily.   nystatin cream (MYCOSTATIN) Apply 1 Application topically 2 (two) times daily for 7 days.   omeprazole (PRILOSEC) 40 MG capsule Take 40 mg by mouth daily.   polyethylene glycol powder (GLYCOLAX/MIRALAX) 17 GM/SCOOP powder Take by mouth as needed.   Propylene Glycol (SYSTANE COMPLETE OP) Apply to eye as needed.   raloxifene (EVISTA) 60 MG tablet Take 60 mg by mouth every evening.   sertraline (ZOLOFT) 50 MG tablet Take 50 mg by mouth daily.   No facility-administered encounter medications on file as of 10/13/2023.    Review of Systems  Constitutional:  Negative for activity change and appetite change.  HENT: Negative.    Respiratory:  Negative for cough and shortness of breath.   Cardiovascular:  Negative for leg swelling.  Gastrointestinal:  Negative for constipation.  Genitourinary: Negative.   Musculoskeletal:  Positive for gait problem. Negative for arthralgias and myalgias.  Skin: Negative.   Neurological:  Negative for dizziness and weakness.  Psychiatric/Behavioral:  Negative for confusion, dysphoric mood and sleep disturbance.     Vitals:   10/13/23 0959  BP: 134/85  Pulse: 78  Resp: 16  Temp: (!) 97.1 F (36.2 C)  Weight: 146 lb (66.2 kg)   Body mass index is 30.51 kg/m. Physical Exam Vitals  reviewed.  Constitutional:      Appearance: Normal appearance.  HENT:     Head: Normocephalic.     Nose: Nose normal.     Mouth/Throat:     Mouth: Mucous membranes are moist.     Pharynx: Oropharynx is clear.  Eyes:     Pupils: Pupils are equal, round, and reactive to light.  Cardiovascular:     Rate and Rhythm: Normal rate and regular rhythm.     Pulses: Normal pulses.     Heart sounds: Normal heart sounds. No murmur heard. Pulmonary:     Effort: Pulmonary effort is normal.     Breath sounds: Normal breath sounds.  Abdominal:     General: Abdomen is flat. Bowel sounds are normal.     Palpations: Abdomen is soft.  Musculoskeletal:        General: No swelling.     Cervical back: Neck supple.  Skin:    General: Skin is warm.  Neurological:     General: No focal deficit present.     Mental Status: She is alert and oriented to person, place, and time.  Psychiatric:        Mood and Affect: Mood normal.        Thought Content: Thought content normal.     Labs reviewed: Basic Metabolic Panel: No results for input(s): "NA", "K", "CL", "CO2", "GLUCOSE", "BUN", "CREATININE", "CALCIUM", "MG", "PHOS" in the last 8760 hours. Liver Function Tests: No results for input(s): "AST", "ALT", "ALKPHOS", "BILITOT", "PROT", "ALBUMIN" in the last 8760 hours. No results for input(s): "LIPASE", "AMYLASE" in the last 8760 hours. No results for input(s): "AMMONIA" in the last 8760 hours. CBC: No results for input(s): "WBC", "NEUTROABS", "HGB", "HCT", "MCV", "PLT" in the last 8760 hours. Cardiac Enzymes: No results for input(s): "CKTOTAL", "CKMB", "CKMBINDEX", "TROPONINI" in the last 8760 hours. BNP: Invalid input(s): "POCBNP" No results found for: "HGBA1C" No results found for: "TSH" No results found for: "VITAMINB12" No results found for: "FOLATE" No results found for: "IRON", "TIBC", "FERRITIN"  Imaging and Procedures obtained prior to SNF admission: DG Ankle Complete Left Result Date:  10/09/2023 CLINICAL DATA:  Elective surgery. EXAM: LEFT ANKLE COMPLETE - 3+ VIEW COMPARISON:  Preoperative imaging. FINDINGS: Three fluoroscopic spot views of the left ankle submitted from the operating room. Lateral plate and screw fixation of distal fibular fracture with syndesmotic screw. Fluoroscopy time 25 seconds. Dose 0.478 mGy. IMPRESSION: Procedural fluoroscopy during ankle fracture fixation. Electronically Signed   By: Narda Rutherford M.D.   On: 10/09/2023 11:08   DG C-Arm 1-60 Min-No Report Result Date: 09/21/2023 Fluoroscopy was utilized by the requesting physician.  No radiographic interpretation.    Assessment/Plan 1. S/P ORIF (open reduction internal fixation) fracture (Primary) Pain Controlled On Aspirin Tylenol PRN NWB  2. Primary hypertension Norvasc  3. Hypothyroidism, unspecified type Follows with her PCP  4. Gastroesophageal reflux disease without esophagitis Prilosec  5. Depression, recurrent (HCC) Zoloft  6. Senile osteoporosis On raloxifene  7. Pruritus Resolved with Vistaril    Family/ staff Communication:   Labs/tests ordered:

## 2023-10-14 ENCOUNTER — Encounter: Payer: Self-pay | Admitting: Internal Medicine

## 2023-10-14 DIAGNOSIS — Z9181 History of falling: Secondary | ICD-10-CM | POA: Diagnosis not present

## 2023-10-14 DIAGNOSIS — M6281 Muscle weakness (generalized): Secondary | ICD-10-CM | POA: Diagnosis not present

## 2023-10-14 DIAGNOSIS — M84372D Stress fracture, left ankle, subsequent encounter for fracture with routine healing: Secondary | ICD-10-CM | POA: Diagnosis not present

## 2023-10-14 DIAGNOSIS — R278 Other lack of coordination: Secondary | ICD-10-CM | POA: Diagnosis not present

## 2023-10-15 DIAGNOSIS — Z9181 History of falling: Secondary | ICD-10-CM | POA: Diagnosis not present

## 2023-10-15 DIAGNOSIS — R278 Other lack of coordination: Secondary | ICD-10-CM | POA: Diagnosis not present

## 2023-10-15 DIAGNOSIS — M84372D Stress fracture, left ankle, subsequent encounter for fracture with routine healing: Secondary | ICD-10-CM | POA: Diagnosis not present

## 2023-10-17 DIAGNOSIS — Z9181 History of falling: Secondary | ICD-10-CM | POA: Diagnosis not present

## 2023-10-17 DIAGNOSIS — M84372D Stress fracture, left ankle, subsequent encounter for fracture with routine healing: Secondary | ICD-10-CM | POA: Diagnosis not present

## 2023-10-17 DIAGNOSIS — M6281 Muscle weakness (generalized): Secondary | ICD-10-CM | POA: Diagnosis not present

## 2023-10-18 DIAGNOSIS — M84372D Stress fracture, left ankle, subsequent encounter for fracture with routine healing: Secondary | ICD-10-CM | POA: Diagnosis not present

## 2023-10-18 DIAGNOSIS — R278 Other lack of coordination: Secondary | ICD-10-CM | POA: Diagnosis not present

## 2023-10-18 DIAGNOSIS — Z9181 History of falling: Secondary | ICD-10-CM | POA: Diagnosis not present

## 2023-10-19 DIAGNOSIS — Z9181 History of falling: Secondary | ICD-10-CM | POA: Diagnosis not present

## 2023-10-19 DIAGNOSIS — M6281 Muscle weakness (generalized): Secondary | ICD-10-CM | POA: Diagnosis not present

## 2023-10-19 DIAGNOSIS — M84372D Stress fracture, left ankle, subsequent encounter for fracture with routine healing: Secondary | ICD-10-CM | POA: Diagnosis not present

## 2023-10-19 DIAGNOSIS — R278 Other lack of coordination: Secondary | ICD-10-CM | POA: Diagnosis not present

## 2023-10-20 DIAGNOSIS — S8262XD Displaced fracture of lateral malleolus of left fibula, subsequent encounter for closed fracture with routine healing: Secondary | ICD-10-CM | POA: Diagnosis not present

## 2023-10-21 DIAGNOSIS — M6281 Muscle weakness (generalized): Secondary | ICD-10-CM | POA: Diagnosis not present

## 2023-10-21 DIAGNOSIS — Z9181 History of falling: Secondary | ICD-10-CM | POA: Diagnosis not present

## 2023-10-21 DIAGNOSIS — R278 Other lack of coordination: Secondary | ICD-10-CM | POA: Diagnosis not present

## 2023-10-21 DIAGNOSIS — M84372D Stress fracture, left ankle, subsequent encounter for fracture with routine healing: Secondary | ICD-10-CM | POA: Diagnosis not present

## 2023-10-24 DIAGNOSIS — Z9181 History of falling: Secondary | ICD-10-CM | POA: Diagnosis not present

## 2023-10-24 DIAGNOSIS — M6281 Muscle weakness (generalized): Secondary | ICD-10-CM | POA: Diagnosis not present

## 2023-10-24 DIAGNOSIS — M84372D Stress fracture, left ankle, subsequent encounter for fracture with routine healing: Secondary | ICD-10-CM | POA: Diagnosis not present

## 2023-10-25 DIAGNOSIS — M84372D Stress fracture, left ankle, subsequent encounter for fracture with routine healing: Secondary | ICD-10-CM | POA: Diagnosis not present

## 2023-10-25 DIAGNOSIS — Z9181 History of falling: Secondary | ICD-10-CM | POA: Diagnosis not present

## 2023-10-25 DIAGNOSIS — R278 Other lack of coordination: Secondary | ICD-10-CM | POA: Diagnosis not present

## 2023-10-26 DIAGNOSIS — M84372D Stress fracture, left ankle, subsequent encounter for fracture with routine healing: Secondary | ICD-10-CM | POA: Diagnosis not present

## 2023-10-26 DIAGNOSIS — Z9181 History of falling: Secondary | ICD-10-CM | POA: Diagnosis not present

## 2023-10-26 DIAGNOSIS — M6281 Muscle weakness (generalized): Secondary | ICD-10-CM | POA: Diagnosis not present

## 2023-10-27 DIAGNOSIS — R278 Other lack of coordination: Secondary | ICD-10-CM | POA: Diagnosis not present

## 2023-10-27 DIAGNOSIS — Z9181 History of falling: Secondary | ICD-10-CM | POA: Diagnosis not present

## 2023-10-27 DIAGNOSIS — M84372D Stress fracture, left ankle, subsequent encounter for fracture with routine healing: Secondary | ICD-10-CM | POA: Diagnosis not present

## 2023-10-28 DIAGNOSIS — M84372D Stress fracture, left ankle, subsequent encounter for fracture with routine healing: Secondary | ICD-10-CM | POA: Diagnosis not present

## 2023-10-28 DIAGNOSIS — R278 Other lack of coordination: Secondary | ICD-10-CM | POA: Diagnosis not present

## 2023-10-28 DIAGNOSIS — M6281 Muscle weakness (generalized): Secondary | ICD-10-CM | POA: Diagnosis not present

## 2023-10-28 DIAGNOSIS — Z9181 History of falling: Secondary | ICD-10-CM | POA: Diagnosis not present

## 2023-10-31 DIAGNOSIS — M84372D Stress fracture, left ankle, subsequent encounter for fracture with routine healing: Secondary | ICD-10-CM | POA: Diagnosis not present

## 2023-10-31 DIAGNOSIS — M6281 Muscle weakness (generalized): Secondary | ICD-10-CM | POA: Diagnosis not present

## 2023-10-31 DIAGNOSIS — Z9181 History of falling: Secondary | ICD-10-CM | POA: Diagnosis not present

## 2023-11-01 DIAGNOSIS — M84372D Stress fracture, left ankle, subsequent encounter for fracture with routine healing: Secondary | ICD-10-CM | POA: Diagnosis not present

## 2023-11-01 DIAGNOSIS — R278 Other lack of coordination: Secondary | ICD-10-CM | POA: Diagnosis not present

## 2023-11-01 DIAGNOSIS — Z9181 History of falling: Secondary | ICD-10-CM | POA: Diagnosis not present

## 2023-11-02 DIAGNOSIS — M84372D Stress fracture, left ankle, subsequent encounter for fracture with routine healing: Secondary | ICD-10-CM | POA: Diagnosis not present

## 2023-11-02 DIAGNOSIS — J301 Allergic rhinitis due to pollen: Secondary | ICD-10-CM | POA: Diagnosis not present

## 2023-11-02 DIAGNOSIS — Z9181 History of falling: Secondary | ICD-10-CM | POA: Diagnosis not present

## 2023-11-02 DIAGNOSIS — J3081 Allergic rhinitis due to animal (cat) (dog) hair and dander: Secondary | ICD-10-CM | POA: Diagnosis not present

## 2023-11-02 DIAGNOSIS — J3089 Other allergic rhinitis: Secondary | ICD-10-CM | POA: Diagnosis not present

## 2023-11-02 DIAGNOSIS — M6281 Muscle weakness (generalized): Secondary | ICD-10-CM | POA: Diagnosis not present

## 2023-11-03 DIAGNOSIS — Z9181 History of falling: Secondary | ICD-10-CM | POA: Diagnosis not present

## 2023-11-03 DIAGNOSIS — M84372D Stress fracture, left ankle, subsequent encounter for fracture with routine healing: Secondary | ICD-10-CM | POA: Diagnosis not present

## 2023-11-03 DIAGNOSIS — R278 Other lack of coordination: Secondary | ICD-10-CM | POA: Diagnosis not present

## 2023-11-04 ENCOUNTER — Non-Acute Institutional Stay (SKILLED_NURSING_FACILITY): Payer: Self-pay | Admitting: Orthopedic Surgery

## 2023-11-04 ENCOUNTER — Encounter: Payer: Self-pay | Admitting: Orthopedic Surgery

## 2023-11-04 DIAGNOSIS — E039 Hypothyroidism, unspecified: Secondary | ICD-10-CM | POA: Diagnosis not present

## 2023-11-04 DIAGNOSIS — R278 Other lack of coordination: Secondary | ICD-10-CM | POA: Diagnosis not present

## 2023-11-04 DIAGNOSIS — Z9889 Other specified postprocedural states: Secondary | ICD-10-CM

## 2023-11-04 DIAGNOSIS — M84372D Stress fracture, left ankle, subsequent encounter for fracture with routine healing: Secondary | ICD-10-CM | POA: Diagnosis not present

## 2023-11-04 DIAGNOSIS — L299 Pruritus, unspecified: Secondary | ICD-10-CM

## 2023-11-04 DIAGNOSIS — I10 Essential (primary) hypertension: Secondary | ICD-10-CM

## 2023-11-04 DIAGNOSIS — F339 Major depressive disorder, recurrent, unspecified: Secondary | ICD-10-CM

## 2023-11-04 DIAGNOSIS — K219 Gastro-esophageal reflux disease without esophagitis: Secondary | ICD-10-CM

## 2023-11-04 DIAGNOSIS — Z9181 History of falling: Secondary | ICD-10-CM | POA: Diagnosis not present

## 2023-11-04 DIAGNOSIS — M810 Age-related osteoporosis without current pathological fracture: Secondary | ICD-10-CM

## 2023-11-04 DIAGNOSIS — Z8781 Personal history of (healed) traumatic fracture: Secondary | ICD-10-CM | POA: Diagnosis not present

## 2023-11-04 DIAGNOSIS — M6281 Muscle weakness (generalized): Secondary | ICD-10-CM | POA: Diagnosis not present

## 2023-11-04 MED ORDER — CLOBETASOL PROPIONATE 0.05 % EX CREA: TOPICAL_CREAM | CUTANEOUS | Status: AC | PRN

## 2023-11-04 NOTE — Progress Notes (Signed)
 Location:  [facility] Room Number: 35/A Place of Service:  SNF (31) Provider:  Octavia Heir, NP   Thana Ates, MD  Patient Care Team: Thana Ates, MD as PCP - General (Internal Medicine)  Extended Emergency Contact Information Primary Emergency Contact: Carmelia Bake Address: [address]          [place] [address] [place] Mobile Phone: [phone] Relation: Spouse  Code Status:  DNR Goals of care: Advanced Directive information    09/21/2023   10:19 AM  Advanced Directives  Does Patient Have a Medical Advance Directive? Yes  Type of Estate agent of [place];Living will  Does patient want to make changes to medical advance directive? No - Patient declined  Copy of Healthcare Power of Attorney in Chart? No - copy requested     Chief Complaint  Patient presents with   Medical Management of Chronic Issues    HPI:  Pt is a 83 y.o. female seen today for medical management of chronic diseases.    She was admitted to [facility] SNF 03/13. PMH: HTN, hypothyroidism, CKD, GERD, osteoporosis, h/o right breast cancer s/p lumpectomy, depression and anxiety.   S/p ORIF left malleous ankle- 02/26 ORIF, cast placed, NWB x 6 weeks, ortho f/u 04/15, denies pain, no recent falls, working with PT/OT, remains on asa for dvt prophylaxis HTN- remains on amlodipine Hypothyroidism- remains on levothyroxine GERD- remains on omeprazole Depression- no changes in mood, supportive family, remains on sertraline Osteoporosis- remains on calcium, vitamin D and Evista Pruritus- unsuccessful trial of Zyrtec, remains on low dose hydroxyzine prn  Recent blood pressures:  04/08- 121/84  04/01- 107/69  03/25- 137/82  Recent weights:  04/01- 150 lbs  03/13- 147.3 lbs   Past Medical History:  Diagnosis Date   Anxiety    Takes Zoloft, follows w/ PCP Dr. Kirby Funk at [facility].   Arthritis    knees   Cancer (HCC)  1991   right breast cancer, s/p chemotherapy and radiation   Dupuytren's contracture    left little finger   GERD (gastroesophageal reflux disease)    Follows with [facility], takes Omeprazole.   History of hiatal hernia    Hypothyroidism    Follows w/ PCP Dr.  Kirby Funk at [facility].   Lupus    cutaneous, very mild   Multiple allergies    Takes weekly allergy shots as of 03/26/22.   Wears glasses    Wears hearing aid    right ear   Past Surgical History:  Procedure Laterality Date   BREAST LUMPECTOMY Right 1991   COLONOSCOPY     hx of several   CYSTOCELE REPAIR N/A 04/12/2022   Procedure: ANTERIOR REPAIR (CYSTOCELE);  Surgeon: Marcelle Overlie, MD;  Location: [facility];  Service: Gynecology;  Laterality: N/A;   DILATION AND CURETTAGE OF UTERUS  2011   MASTOIDECTOMY Bilateral    1991 and 2002   ORIF ANKLE FRACTURE Left 09/21/2023   Procedure: OPEN REDUCTION INTERNAL FIXATION (ORIF) LATERAL MALLEOLUS ANKLE FRACTURE;  Surgeon: Netta Cedars, MD;  Location: [facility];  Service: Orthopedics;  Laterality: Left;  GENERAL AND BLOCK   RECTOCELE REPAIR N/A 04/12/2022   Procedure: POSTERIOR REPAIR (RECTOCELE);  Surgeon: Marcelle Overlie, MD;  Location: [facility];  Service: Gynecology;  Laterality: N/A;   SYNDESMOSIS REPAIR Left 09/21/2023   Procedure: POSSIBLE SYNDESMOSIS REPAIR;  Surgeon: Netta Cedars, MD;  Location: [facility];  Service: Orthopedics;  Laterality: Left;   TONSILLECTOMY     as a child   TUBAL LIGATION  1974   TYMPANOMASTOIDECTOMY Right 05/08/2013   Procedure: RIGHT  CANAL-UP TYMPANOMASTOIDECTOMY; MICRODISSECTION USING THE OR MICROSCOPE; PLACEMENT OF PAPARELLA TUBE IN RIGHT EAR ;  Surgeon: Carolan Shiver, MD;  Location: [facility];  Service: ENT;  Laterality: Right;   TYMPANOSTOMY TUBE PLACEMENT     multiple times per pt   VAGINAL HYSTERECTOMY Bilateral 04/12/2022    Procedure: TOTAL VAGINAL HYSTERECTOMY;  Surgeon: Marcelle Overlie, MD;  Location: [facility] ;  Service: Gynecology;  Laterality: Bilateral;    Allergies  Allergen Reactions   Sulfa Antibiotics     Mouth blisters   Tape     Blisters skin    Outpatient Encounter Medications as of 11/04/2023  Medication Sig   amLODipine (NORVASC) 2.5 MG tablet Take 2.5 mg by mouth daily.   aspirin EC 81 MG tablet Take 81 mg by mouth 2 times daily at 12 noon and 4 pm. Swallow whole.   calcium-vitamin D (OSCAL WITH D) 500-200 MG-UNIT per tablet Take 1 tablet by mouth daily with breakfast.   Cholecalciferol (VITAMIN D3) 25 MCG (1000 UT) CAPS Take by mouth. 3 tablets daily   clobetasol cream (TEMOVATE) 0.05 % Apply topically as needed.   EPINEPHrine 0.3 mg/0.3 mL IJ SOAJ injection INJECT INTRAMUSCULARLY AS DIRECTED   hydrOXYzine (ATARAX) 10 MG tablet Take 1 tablet (10 mg total) by mouth 2 (two) times daily as needed.   levothyroxine (SYNTHROID, LEVOTHROID) 75 MCG tablet Take 75 mcg by mouth daily before breakfast.   montelukast (SINGULAIR) 10 MG tablet Take 10 mg by mouth at bedtime.   Multiple Vitamins-Minerals (MULTIVITAMIN WITH MINERALS) tablet Take 1 tablet by mouth daily.   omeprazole (PRILOSEC) 40 MG capsule Take 40 mg by mouth daily.   polyethylene glycol powder (GLYCOLAX/MIRALAX) 17 GM/SCOOP powder Take by mouth as needed.   Propylene Glycol (SYSTANE COMPLETE OP) Apply to eye as needed.   raloxifene (EVISTA) 60 MG tablet Take 60 mg by mouth every evening.   sertraline (ZOLOFT) 50 MG tablet Take 50 mg by mouth daily.   No facility-administered encounter medications on file as of 11/04/2023.    Review of Systems  Constitutional:  Negative for fatigue and fever.  HENT:  Positive for hearing loss. Negative for trouble swallowing.   Respiratory:  Negative for cough, shortness of breath and wheezing.   Cardiovascular:  Negative for chest pain and leg swelling.  Gastrointestinal:   Negative for abdominal distention and abdominal pain.  Genitourinary:  Negative for dysuria and hematuria.  Musculoskeletal:  Positive for arthralgias and gait problem.  Skin:  Positive for wound.  Neurological:  Positive for weakness. Negative for dizziness and headaches.  Psychiatric/Behavioral:  Positive for dysphoric mood. Negative for confusion and sleep disturbance. The patient is not nervous/anxious.     Immunization History  Administered Date(s) Administered   PFIZER(Purple Top)SARS-COV-2 Vaccination 08/10/2019, 08/31/2019   Zoster Recombinant(Shingrix) 07/21/2018, 02/28/2019   Pertinent  Health Maintenance Due  Topic Date Due   INFLUENZA VACCINE  02/24/2024   DEXA SCAN  Completed      10/10/2023   12:14 PM  Fall Risk  Falls in the past year? 1  Was there an injury with Fall? 1  Fall Risk Category Calculator 2  Patient at Risk for Falls Due to History of fall(s);Impaired balance/gait  Fall risk Follow up Falls evaluation completed;Education provided   Functional  Status Survey:    Vitals:   11/04/23 1510  BP: 121/84  Pulse: 77  Resp: 16  Temp: (!) 96.7 F (35.9 C)  SpO2: 96%  Weight: 150 lb (68 kg)  Height: 4\' 10"  (1.473 m)   Body mass index is 31.35 kg/m. Physical Exam Vitals reviewed.  Constitutional:      General: She is not in acute distress. HENT:     Head: Normocephalic.  Eyes:     General:        Right eye: No discharge.        Left eye: No discharge.  Cardiovascular:     Rate and Rhythm: Normal rate and regular rhythm.     Pulses: Normal pulses.     Heart sounds: Normal heart sounds.  Pulmonary:     Effort: Pulmonary effort is normal.     Breath sounds: Normal breath sounds.  Abdominal:     General: Bowel sounds are normal. There is no distension.     Palpations: Abdomen is soft.     Tenderness: There is no abdominal tenderness.  Musculoskeletal:     Cervical back: Neck supple.     Right lower leg: No edema.     Left lower leg: No  edema.     Comments: LLE cast CDI, no warmth/swelling, cap refill < 3 sec/full sensation/movement of toes  Skin:    General: Skin is warm.     Capillary Refill: Capillary refill takes less than 2 seconds.  Neurological:     General: No focal deficit present.     Mental Status: She is alert and oriented to person, place, and time.     Gait: Gait abnormal.  Psychiatric:        Mood and Affect: Mood normal.     Labs reviewed: No results for input(s): "NA", "K", "CL", "CO2", "GLUCOSE", "BUN", "CREATININE", "CALCIUM", "MG", "PHOS" in the last 8760 hours. No results for input(s): "AST", "ALT", "ALKPHOS", "BILITOT", "PROT", "ALBUMIN" in the last 8760 hours. No results for input(s): "WBC", "NEUTROABS", "HGB", "HCT", "MCV", "PLT" in the last 8760 hours. No results found for: "TSH" No results found for: "HGBA1C" No results found for: "CHOL", "HDL", "LDLCALC", "LDLDIRECT", "TRIG", "CHOLHDL"  Significant Diagnostic Results in last 30 days:  No results found.  Assessment/Plan 1. S/P ORIF (open reduction internal fixation) fracture (Primary) - 02/14 mechanical fall - 02/17 xray confirmed fracture  - 02/26 ORIF lateral malleolus - NWB x 6 weeks - ortho f/u 04/15 - asa 81 mg BID x 42 days for DVT prophylaxis - no pain  - cont PT/OT  2. Primary hypertension - controlled with amlodipine  3. Hypothyroidism, unspecified type - cont levothyroxine  4. Gastroesophageal reflux disease without esophagitis - cont omeprazole  5. Depression, recurrent (HCC) - no mood changes - cont Zoloft  6. Senile osteoporosis - cont calcium, vitamin D and Elavil  7. Pruritus - associated with cast to LLE - unsuccessful trial Zyrtec - cont hydroxyzine prn    Family/ staff Communication: plan discussed with patient and nurse  Labs/tests ordered:  cbc/diff, cmp, TSH 11/07/2023

## 2023-11-07 DIAGNOSIS — I10 Essential (primary) hypertension: Secondary | ICD-10-CM | POA: Diagnosis not present

## 2023-11-07 DIAGNOSIS — N3281 Overactive bladder: Secondary | ICD-10-CM | POA: Diagnosis not present

## 2023-11-07 DIAGNOSIS — E538 Deficiency of other specified B group vitamins: Secondary | ICD-10-CM | POA: Diagnosis not present

## 2023-11-07 DIAGNOSIS — M6281 Muscle weakness (generalized): Secondary | ICD-10-CM | POA: Diagnosis not present

## 2023-11-07 DIAGNOSIS — Z9181 History of falling: Secondary | ICD-10-CM | POA: Diagnosis not present

## 2023-11-07 DIAGNOSIS — M84372D Stress fracture, left ankle, subsequent encounter for fracture with routine healing: Secondary | ICD-10-CM | POA: Diagnosis not present

## 2023-11-07 DIAGNOSIS — E039 Hypothyroidism, unspecified: Secondary | ICD-10-CM | POA: Diagnosis not present

## 2023-11-08 DIAGNOSIS — R278 Other lack of coordination: Secondary | ICD-10-CM | POA: Diagnosis not present

## 2023-11-08 DIAGNOSIS — M84372D Stress fracture, left ankle, subsequent encounter for fracture with routine healing: Secondary | ICD-10-CM | POA: Diagnosis not present

## 2023-11-08 DIAGNOSIS — Z9181 History of falling: Secondary | ICD-10-CM | POA: Diagnosis not present

## 2023-11-08 DIAGNOSIS — S8262XA Displaced fracture of lateral malleolus of left fibula, initial encounter for closed fracture: Secondary | ICD-10-CM | POA: Diagnosis not present

## 2023-11-09 ENCOUNTER — Non-Acute Institutional Stay (SKILLED_NURSING_FACILITY): Payer: Self-pay | Admitting: Orthopedic Surgery

## 2023-11-09 ENCOUNTER — Encounter: Payer: Self-pay | Admitting: Orthopedic Surgery

## 2023-11-09 DIAGNOSIS — L299 Pruritus, unspecified: Secondary | ICD-10-CM

## 2023-11-09 DIAGNOSIS — Z8781 Personal history of (healed) traumatic fracture: Secondary | ICD-10-CM | POA: Diagnosis not present

## 2023-11-09 DIAGNOSIS — R21 Rash and other nonspecific skin eruption: Secondary | ICD-10-CM | POA: Diagnosis not present

## 2023-11-09 DIAGNOSIS — M84372D Stress fracture, left ankle, subsequent encounter for fracture with routine healing: Secondary | ICD-10-CM | POA: Diagnosis not present

## 2023-11-09 DIAGNOSIS — Z9889 Other specified postprocedural states: Secondary | ICD-10-CM | POA: Diagnosis not present

## 2023-11-09 DIAGNOSIS — Z9181 History of falling: Secondary | ICD-10-CM | POA: Diagnosis not present

## 2023-11-09 DIAGNOSIS — M6281 Muscle weakness (generalized): Secondary | ICD-10-CM | POA: Diagnosis not present

## 2023-11-09 MED ORDER — PREDNISONE 20 MG PO TABS: 20.0000 mg | ORAL_TABLET | Freq: Every day | ORAL | Status: AC

## 2023-11-09 MED ORDER — HYDROXYZINE HCL 10 MG PO TABS: 5.0000 mg | ORAL_TABLET | Freq: Two times a day (BID) | ORAL | Status: DC | PRN

## 2023-11-09 MED ORDER — TRIAMCINOLONE ACETONIDE 0.1 % EX CREA: TOPICAL_CREAM | CUTANEOUS | Status: AC

## 2023-11-09 NOTE — Progress Notes (Signed)
 Location:  [facility] Room Number: 35/A Place of Service:  SNF (31) Provider:  Arnetha Bhat, NP   Tena Feeling, MD  Patient Care Team: Tena Feeling, MD as PCP - General (Internal Medicine)  Extended Emergency Contact Information Primary Emergency Contact: Behlke,[name] Address: [address]          [name] [address] [place] Mobile Phone: [phone] Relation: Spouse  Code Status:  DNR Goals of care: Advanced Directive information    09/21/2023   10:19 AM  Advanced Directives  Does Patient Have a Medical Advance Directive? Yes  Type of Estate agent of [place];Living will  Does patient want to make changes to medical advance directive? No - Patient declined  Copy of Healthcare Power of Attorney in Chart? No - copy requested     Chief Complaint  Patient presents with   Acute Visit    rash    HPI:  Pt is a 83 y.o. female seen today for acute visit due to rash.   She was admitted to [facility] 03/13. PMH: HTN, hypothyroidism, CKD, GERD, osteoporosis, h/o right breast cancer s/p lumpectomy, depression and anxiety.   Increased rash to right arm x 1 day. She admits to intermittent itching since admission. She has been using benadryl cream prn at bedside. She believes symptoms are associated with detergent used at facility. Her son has been washing clothes at her apartment. Bath towels and linens continue to be washed by facility. She is prescribed hydroxyzine 10 mg prn but reports it makes her "too drowsy." Increased rash to right arm began within past day. Rash is very itchy. Denies chest pain, shortness of breath, wheezing, sneezing or itchy eyes. Afebrile. Vitals stable.   04/15 f/u with ortho. LLE cast removed. Cam boot started. +25% WBAT each week, in boot with walker at all times. Followed by PT/OT. Pain minimal. Remains on calcium/vit D supplement.   Past Surgical History:  Procedure Laterality  Date   BREAST LUMPECTOMY Right 1991   COLONOSCOPY     hx of several   CYSTOCELE REPAIR N/A 04/12/2022   Procedure: ANTERIOR REPAIR (CYSTOCELE);  Surgeon: Thurman Flores, MD;  Location: [facility];  Service: Gynecology;  Laterality: N/A;   DILATION AND CURETTAGE OF UTERUS  2011   MASTOIDECTOMY Bilateral    1991 and 2002   ORIF ANKLE FRACTURE Left 09/21/2023   Procedure: OPEN REDUCTION INTERNAL FIXATION (ORIF) LATERAL MALLEOLUS ANKLE FRACTURE;  Surgeon: Ali Ink, MD;  Location: [facility];  Service: Orthopedics;  Laterality: Left;  GENERAL AND BLOCK   RECTOCELE REPAIR N/A 04/12/2022   Procedure: POSTERIOR REPAIR (RECTOCELE);  Surgeon: Thurman Flores, MD;  Location: [facility];  Service: Gynecology;  Laterality: N/A;   SYNDESMOSIS REPAIR Left 09/21/2023   Procedure: POSSIBLE SYNDESMOSIS REPAIR;  Surgeon: Ali Ink, MD;  Location: [facility];  Service: Orthopedics;  Laterality: Left;   TONSILLECTOMY     as a child   TUBAL LIGATION  1974   TYMPANOMASTOIDECTOMY Right 05/08/2013   Procedure: RIGHT  CANAL-UP TYMPANOMASTOIDECTOMY; MICRODISSECTION USING THE OR MICROSCOPE; PLACEMENT OF PAPARELLA TUBE IN RIGHT EAR ;  Surgeon: Theador Finer, MD;  Location: [facility];  Service: ENT;  Laterality: Right;   TYMPANOSTOMY TUBE PLACEMENT     multiple times per pt   VAGINAL HYSTERECTOMY Bilateral 04/12/2022   Procedure: TOTAL VAGINAL HYSTERECTOMY;  Surgeon: Thurman Flores, MD;  [facility]: [facility];  Service: Gynecology;  Laterality: Bilateral;    Allergies  Allergen Reactions   Sulfa Antibiotics     Mouth blisters   Tape     Blisters skin    Outpatient Encounter Medications as of 11/09/2023  Medication Sig   amLODipine (NORVASC) 2.5 MG tablet Take 2.5 mg by mouth daily.   aspirin EC 81 MG tablet Take 81 mg by mouth 2 times daily at 12 noon and 4 pm. Swallow whole.   calcium-vitamin D  (OSCAL WITH D) 500-200 MG-UNIT per tablet Take 1 tablet by mouth daily with breakfast.   Cholecalciferol (VITAMIN D3) 25 MCG (1000 UT) CAPS Take by mouth. 3 tablets daily   clobetasol cream (TEMOVATE) 0.05 % Apply topically as needed.   cyanocobalamin 1000 MCG tablet Take 1,000 mcg by mouth 3 (three) times a week. M/W/F   hydrOXYzine (ATARAX) 10 MG tablet Take 1 tablet (10 mg total) by mouth 2 (two) times daily as needed.   levothyroxine (SYNTHROID, LEVOTHROID) 75 MCG tablet Take 75 mcg by mouth daily before breakfast.   montelukast (SINGULAIR) 10 MG tablet Take 10 mg by mouth at bedtime.   Multiple Vitamins-Minerals (MULTIVITAMIN WITH MINERALS) tablet Take 1 tablet by mouth daily.   omeprazole (PRILOSEC) 40 MG capsule Take 40 mg by mouth daily.   polyethylene glycol powder (GLYCOLAX/MIRALAX) 17 GM/SCOOP powder Take by mouth as needed.   raloxifene (EVISTA) 60 MG tablet Take 60 mg by mouth every evening.   sertraline (ZOLOFT) 50 MG tablet Take 50 mg by mouth daily.   No facility-administered encounter medications on file as of 11/09/2023.    Review of Systems  Constitutional:  Negative for fatigue and fever.  HENT:  Negative for sore throat and trouble swallowing.   Eyes:  Negative for visual disturbance.  Respiratory:  Negative for cough, shortness of breath and wheezing.   Cardiovascular:  Negative for chest pain and leg swelling.  Gastrointestinal:  Negative for abdominal distention and abdominal pain.  Genitourinary:  Negative for dysuria.  Musculoskeletal:  Positive for arthralgias and gait problem.  Skin:  Positive for wound.  Neurological:  Negative for dizziness and light-headedness.  Psychiatric/Behavioral:  Negative for confusion and dysphoric mood. The patient is not nervous/anxious.     Immunization History  Administered Date(s) Administered   PFIZER(Purple Top)SARS-COV-2 Vaccination 08/10/2019, 08/31/2019   Zoster Recombinant(Shingrix) 07/21/2018, 02/28/2019   Pertinent   Health Maintenance Due  Topic Date Due   INFLUENZA VACCINE  02/24/2024   DEXA SCAN  Completed      10/10/2023   12:14 PM  Fall Risk  Falls in the past year? 1  Was there an injury with Fall? 1  Fall Risk Category Calculator 2  Patient at Risk for Falls Due to History of fall(s);Impaired balance/gait  Fall risk Follow up Falls evaluation completed;Education provided   Functional Status Survey:    Vitals:   11/09/23 1140  BP: (!) 141/88  Pulse: 86  Resp: 17  Temp: (!) 97.2 F (36.2 C)  SpO2: 93%  Weight: 150 lb (68 kg)  Height: 4\' 10"  (1.473 m)   Body mass index is 31.35 kg/m. Physical Exam Vitals reviewed.  Constitutional:      General: She is not in acute distress. HENT:     Head: Normocephalic.  Eyes:     General:        Right eye: No discharge.        Left eye: No discharge.  Cardiovascular:     Rate  and Rhythm: Normal rate and regular rhythm.     Pulses: Normal pulses.     Heart sounds: Normal heart sounds.  Pulmonary:     Effort: Pulmonary effort is normal.     Breath sounds: Normal breath sounds.  Abdominal:     General: Bowel sounds are normal.     Palpations: Abdomen is soft.  Musculoskeletal:     Cervical back: Neck supple.     Right lower leg: No edema.     Left lower leg: No edema.  Skin:    General: Skin is warm.     Capillary Refill: Capillary refill takes less than 2 seconds.     Comments: Raised rash to right forearm and upper arm. Left ankle surgical incision CDI, steri strips x 5 in place.  Neurological:     General: No focal deficit present.     Mental Status: She is alert and oriented to person, place, and time.     Gait: Gait abnormal.  Psychiatric:        Mood and Affect: Mood normal.     Labs reviewed: No results for input(s): "NA", "K", "CL", "CO2", "GLUCOSE", "BUN", "CREATININE", "CALCIUM", "MG", "PHOS" in the last 8760 hours. No results for input(s): "AST", "ALT", "ALKPHOS", "BILITOT", "PROT", "ALBUMIN" in the last 8760  hours. No results for input(s): "WBC", "NEUTROABS", "HGB", "HCT", "MCV", "PLT" in the last 8760 hours. No results found for: "TSH" No results found for: "HGBA1C" No results found for: "CHOL", "HDL", "LDLCALC", "LDLDIRECT", "TRIG", "CHOLHDL"  Significant Diagnostic Results in last 30 days:  No results found.  Assessment/Plan 1. Rash and nonspecific skin eruption (Primary) - involved right arm and upper arm - using benadryl cream without success - thought to be caused by facility detergent - son now washing clothes/ facility continues to wash bed linens and towels - start low dose prednisone due to symptoms - start triamcinolone cream - use ice prn - nurse to discuss allergy with facility manager - predniSONE (DELTASONE) 20 MG tablet; Take 1 tablet (20 mg total) by mouth daily with breakfast for 7 days. - triamcinolone cream (KENALOG) 0.1 %; Apply 1 Application topically 2 (two) times daily for 3 days, THEN 1 Application 2 (two) times daily as needed. - hydrOXYzine (ATARAX) 10 MG tablet; Take 0.5 tablets (5 mg total) by mouth 2 (two) times daily as needed.  2. S/P ORIF (open reduction internal fixation) fracture - 04/15 ortho f/u - cast off - Cam Boot at all times - + 25% WBAT per week with boot and walker  - cont calcium/vit D supplement - minimal pain - cont tylenol  3. Pruritus - reports drowsiness with hydroxyzine 10 mg  - will reduce to 5 mg     Family/ staff Communication: plan discussed with patient and nurse  Labs/tests ordered:  none

## 2023-11-10 DIAGNOSIS — R278 Other lack of coordination: Secondary | ICD-10-CM | POA: Diagnosis not present

## 2023-11-10 DIAGNOSIS — Z4589 Encounter for adjustment and management of other implanted devices: Secondary | ICD-10-CM | POA: Diagnosis not present

## 2023-11-10 DIAGNOSIS — H6993 Unspecified Eustachian tube disorder, bilateral: Secondary | ICD-10-CM | POA: Diagnosis not present

## 2023-11-10 DIAGNOSIS — Z9181 History of falling: Secondary | ICD-10-CM | POA: Diagnosis not present

## 2023-11-10 DIAGNOSIS — M84372D Stress fracture, left ankle, subsequent encounter for fracture with routine healing: Secondary | ICD-10-CM | POA: Diagnosis not present

## 2023-11-10 DIAGNOSIS — H90A31 Mixed conductive and sensorineural hearing loss, unilateral, right ear with restricted hearing on the contralateral side: Secondary | ICD-10-CM | POA: Diagnosis not present

## 2023-11-11 DIAGNOSIS — R278 Other lack of coordination: Secondary | ICD-10-CM | POA: Diagnosis not present

## 2023-11-11 DIAGNOSIS — M84372D Stress fracture, left ankle, subsequent encounter for fracture with routine healing: Secondary | ICD-10-CM | POA: Diagnosis not present

## 2023-11-11 DIAGNOSIS — Z9181 History of falling: Secondary | ICD-10-CM | POA: Diagnosis not present

## 2023-11-11 DIAGNOSIS — M6281 Muscle weakness (generalized): Secondary | ICD-10-CM | POA: Diagnosis not present

## 2023-11-14 DIAGNOSIS — M6281 Muscle weakness (generalized): Secondary | ICD-10-CM | POA: Diagnosis not present

## 2023-11-14 DIAGNOSIS — Z9181 History of falling: Secondary | ICD-10-CM | POA: Diagnosis not present

## 2023-11-14 DIAGNOSIS — M84372D Stress fracture, left ankle, subsequent encounter for fracture with routine healing: Secondary | ICD-10-CM | POA: Diagnosis not present

## 2023-11-16 DIAGNOSIS — Z9181 History of falling: Secondary | ICD-10-CM | POA: Diagnosis not present

## 2023-11-16 DIAGNOSIS — M6281 Muscle weakness (generalized): Secondary | ICD-10-CM | POA: Diagnosis not present

## 2023-11-16 DIAGNOSIS — M84372D Stress fracture, left ankle, subsequent encounter for fracture with routine healing: Secondary | ICD-10-CM | POA: Diagnosis not present

## 2023-11-17 ENCOUNTER — Non-Acute Institutional Stay (SKILLED_NURSING_FACILITY): Payer: Self-pay | Admitting: Internal Medicine

## 2023-11-17 DIAGNOSIS — R21 Rash and other nonspecific skin eruption: Secondary | ICD-10-CM | POA: Diagnosis not present

## 2023-11-17 DIAGNOSIS — M84372D Stress fracture, left ankle, subsequent encounter for fracture with routine healing: Secondary | ICD-10-CM | POA: Diagnosis not present

## 2023-11-17 DIAGNOSIS — Z8781 Personal history of (healed) traumatic fracture: Secondary | ICD-10-CM

## 2023-11-17 DIAGNOSIS — R278 Other lack of coordination: Secondary | ICD-10-CM | POA: Diagnosis not present

## 2023-11-17 DIAGNOSIS — Z9889 Other specified postprocedural states: Secondary | ICD-10-CM | POA: Diagnosis not present

## 2023-11-17 DIAGNOSIS — L299 Pruritus, unspecified: Secondary | ICD-10-CM

## 2023-11-17 DIAGNOSIS — Z9181 History of falling: Secondary | ICD-10-CM | POA: Diagnosis not present

## 2023-11-18 ENCOUNTER — Encounter: Payer: Self-pay | Admitting: Internal Medicine

## 2023-11-18 DIAGNOSIS — R278 Other lack of coordination: Secondary | ICD-10-CM | POA: Diagnosis not present

## 2023-11-18 DIAGNOSIS — M84372D Stress fracture, left ankle, subsequent encounter for fracture with routine healing: Secondary | ICD-10-CM | POA: Diagnosis not present

## 2023-11-18 DIAGNOSIS — R21 Rash and other nonspecific skin eruption: Secondary | ICD-10-CM | POA: Insufficient documentation

## 2023-11-18 DIAGNOSIS — Z9181 History of falling: Secondary | ICD-10-CM | POA: Diagnosis not present

## 2023-11-18 DIAGNOSIS — M6281 Muscle weakness (generalized): Secondary | ICD-10-CM | POA: Diagnosis not present

## 2023-11-18 NOTE — Progress Notes (Signed)
 [facility] Service:  SNF (31)  Provider:   Code Status:  Goals of Care:     09/21/2023   10:19 AM  Advanced Directives  Does Patient Have a Medical Advance Directive? Yes  Type of Estate agent of [place];Living will  Does patient want to make changes to medical advance directive? No - Patient declined  Copy of Healthcare Power of Attorney in Chart? No - copy requested     Chief Complaint  Patient presents with   Acute Visit    HPI: Patient is a 83 y.o. female seen today for an acute visit for Itching and rash  SNF for Rehab Patient had a mechanical fall on 02/17.  It showed mildly displaced fracture of Left distal fibular.  She underwent ORIF lateral malleolus ankle on 02/26.  She is nonweightbearing for 6 weeks.   Patient also has a history of hypertension, CKD, GERD and osteoporosis, depression and anxiety    Rash Noticed in right arm Given 20 mg of prednisone  last week that is resolved Dose finished and today had severe itching in her Feet area Is applying triamcinolone    She is planning to see Dr Almeda Jacobs allergist on Mon for her Allergic Extract Shot   Past Medical History:  Diagnosis Date   Anxiety    Takes Zoloft , follows w/ PCP Dr. Lysle Saunas at [facility].   Arthritis    knees   Cancer (HCC) 1991   right breast cancer, s/p chemotherapy and radiation   Dupuytren's contracture    left little finger   GERD (gastroesophageal reflux disease)    Follows with [facility], takes Omeprazole.   History of hiatal hernia    Hypothyroidism    Follows w/ PCP Dr.  Lysle Saunas at [facility].   Lupus    cutaneous, very mild   Multiple allergies    Takes weekly allergy shots as of 03/26/22.   Wears glasses    Wears hearing aid    right ear    Past Surgical History:  Procedure Laterality Date   BREAST LUMPECTOMY Right 1991   COLONOSCOPY     hx of several   CYSTOCELE REPAIR N/A  04/12/2022   Procedure: ANTERIOR REPAIR (CYSTOCELE);  Surgeon: Thurman Flores, MD;  Location: [facility];  Service: Gynecology;  Laterality: N/A;   DILATION AND CURETTAGE OF UTERUS  2011   MASTOIDECTOMY Bilateral    1991 and 2002   ORIF ANKLE FRACTURE Left 09/21/2023   Procedure: OPEN REDUCTION INTERNAL FIXATION (ORIF) LATERAL MALLEOLUS ANKLE FRACTURE;  Surgeon: Ali Ink, MD;  Location: [facility];  Service: Orthopedics;  Laterality: Left;  GENERAL AND BLOCK   RECTOCELE REPAIR N/A 04/12/2022   Procedure: POSTERIOR REPAIR (RECTOCELE);  Surgeon: Thurman Flores, MD;  Location: [facility];  Service: Gynecology;  Laterality: N/A;   SYNDESMOSIS REPAIR Left 09/21/2023   Procedure: POSSIBLE SYNDESMOSIS REPAIR;  Surgeon: Ali Ink, MD;  Location: [facility];  Service: Orthopedics;  Laterality: Left;   TONSILLECTOMY     as a child   TUBAL LIGATION  1974   TYMPANOMASTOIDECTOMY Right 05/08/2013   Procedure: RIGHT  CANAL-UP TYMPANOMASTOIDECTOMY; MICRODISSECTION USING THE OR MICROSCOPE; PLACEMENT OF PAPARELLA TUBE IN RIGHT EAR ;  Surgeon: Theador Finer, MD;  Location: [facility];  Service: ENT;  Laterality: Right;   TYMPANOSTOMY TUBE PLACEMENT     multiple times per pt   VAGINAL HYSTERECTOMY Bilateral  04/12/2022   Procedure: TOTAL VAGINAL HYSTERECTOMY;  Surgeon: Thurman Flores, MD;  Location: [facility];  Service: Gynecology;  Laterality: Bilateral;    Allergies  Allergen Reactions   Sulfa Antibiotics     Mouth blisters   Tape     Blisters skin    Outpatient Encounter Medications as of 11/17/2023  Medication Sig   amLODipine (NORVASC) 2.5 MG tablet Take 2.5 mg by mouth daily.   aspirin EC 81 MG tablet Take 81 mg by mouth 2 times daily at 12 noon and 4 pm. Swallow whole.   calcium -vitamin D  (OSCAL WITH D) 500-200 MG-UNIT per tablet Take 1 tablet by mouth daily with breakfast.    Cholecalciferol (VITAMIN D3) 25 MCG (1000 UT) CAPS Take by mouth. 3 tablets daily   clobetasol  cream (TEMOVATE ) 0.05 % Apply topically as needed.   cyanocobalamin  1000 MCG tablet Take 1,000 mcg by mouth 3 (three) times a week. M/W/F   hydrOXYzine  (ATARAX ) 10 MG tablet Take 0.5 tablets (5 mg total) by mouth 2 (two) times daily as needed.   levothyroxine  (SYNTHROID , LEVOTHROID) 75 MCG tablet Take 75 mcg by mouth daily before breakfast.   montelukast  (SINGULAIR ) 10 MG tablet Take 10 mg by mouth at bedtime.   Multiple Vitamins-Minerals (MULTIVITAMIN WITH MINERALS) tablet Take 1 tablet by mouth daily.   omeprazole (PRILOSEC) 40 MG capsule Take 40 mg by mouth daily.   polyethylene glycol powder (GLYCOLAX /MIRALAX ) 17 GM/SCOOP powder Take by mouth as needed.   raloxifene  (EVISTA ) 60 MG tablet Take 60 mg by mouth every evening.   sertraline  (ZOLOFT ) 50 MG tablet Take 50 mg by mouth daily.   triamcinolone  cream (KENALOG ) 0.1 % Apply 1 Application topically 2 (two) times daily for 3 days, THEN 1 Application 2 (two) times daily as needed.   No facility-administered encounter medications on file as of 11/17/2023.    Review of Systems:  Review of Systems  Constitutional:  Negative for activity change and appetite change.  HENT: Negative.    Respiratory:  Negative for cough and shortness of breath.   Cardiovascular:  Negative for leg swelling.  Gastrointestinal:  Negative for constipation.  Genitourinary: Negative.   Musculoskeletal:  Positive for gait problem. Negative for arthralgias and myalgias.  Skin:  Positive for rash.  Neurological:  Negative for dizziness and weakness.  Psychiatric/Behavioral:  Negative for confusion, dysphoric mood and sleep disturbance.     Health Maintenance  Topic Date Due   DTaP/Tdap/Td (1 - Tdap) Never done   Pneumonia Vaccine 40+ Years old (1 of 1 - PCV) Never done   COVID-19 Vaccine (3 - Pfizer risk series) 09/28/2019   Medicare Annual Wellness (AWV)  07/14/2023    INFLUENZA VACCINE  02/24/2024   DEXA SCAN  Completed   Zoster Vaccines- Shingrix  Completed   HPV VACCINES  Aged Out   Meningococcal B Vaccine  Aged Out    Physical Exam: Vitals:   11/17/23 1157  BP: 137/83  Pulse: 77  Resp: 17  Temp: 98.1 F (36.7 C)   There is no height or weight on file to calculate BMI. Physical Exam Constitutional:      Appearance: Normal appearance.  HENT:     Head: Normocephalic.     Nose: Nose normal.     Mouth/Throat:     Mouth: Mucous membranes are moist.     Pharynx: Oropharynx is clear.  Eyes:     Pupils: Pupils are equal, round, and reactive to light.  Musculoskeletal:  General: No swelling.  Skin:    Comments: Macular rash in the top her Left Foot Not tender or Warm  Neurological:     Mental Status: She is alert and oriented to person, place, and time.     Labs reviewed: Basic Metabolic Panel: No results for input(s): "NA", "K", "CL", "CO2", "GLUCOSE", "BUN", "CREATININE", "CALCIUM ", "MG", "PHOS", "TSH" in the last 8760 hours. Liver Function Tests: No results for input(s): "AST", "ALT", "ALKPHOS", "BILITOT", "PROT", "ALBUMIN" in the last 8760 hours. No results for input(s): "LIPASE", "AMYLASE" in the last 8760 hours. No results for input(s): "AMMONIA" in the last 8760 hours. CBC: No results for input(s): "WBC", "NEUTROABS", "HGB", "HCT", "MCV", "PLT" in the last 8760 hours. Lipid Panel: No results for input(s): "CHOL", "HDL", "LDLCALC", "TRIG", "CHOLHDL", "LDLDIRECT" in the last 8760 hours. No results found for: "HGBA1C"  Procedures since last visit: No results found.  Assessment/Plan 1. Rash and nonspecific skin eruption (Primary) Will do Prednisone  10 mg every day for 1 week Continue Triamcinolone  PRN for now Has appointment with her Allergist 2. S/P ORIF (open reduction internal fixation) fracture Has CAM boot which Is Probably making her itching worse     Labs/tests ordered:  * No order type specified * Next  appt:  Visit date not found

## 2023-11-21 DIAGNOSIS — M6281 Muscle weakness (generalized): Secondary | ICD-10-CM | POA: Diagnosis not present

## 2023-11-21 DIAGNOSIS — Z9181 History of falling: Secondary | ICD-10-CM | POA: Diagnosis not present

## 2023-11-21 DIAGNOSIS — J3089 Other allergic rhinitis: Secondary | ICD-10-CM | POA: Diagnosis not present

## 2023-11-21 DIAGNOSIS — M84372D Stress fracture, left ankle, subsequent encounter for fracture with routine healing: Secondary | ICD-10-CM | POA: Diagnosis not present

## 2023-11-22 DIAGNOSIS — R278 Other lack of coordination: Secondary | ICD-10-CM | POA: Diagnosis not present

## 2023-11-22 DIAGNOSIS — Z9181 History of falling: Secondary | ICD-10-CM | POA: Diagnosis not present

## 2023-11-22 DIAGNOSIS — M84372D Stress fracture, left ankle, subsequent encounter for fracture with routine healing: Secondary | ICD-10-CM | POA: Diagnosis not present

## 2023-11-23 DIAGNOSIS — M6281 Muscle weakness (generalized): Secondary | ICD-10-CM | POA: Diagnosis not present

## 2023-11-23 DIAGNOSIS — Z9181 History of falling: Secondary | ICD-10-CM | POA: Diagnosis not present

## 2023-11-23 DIAGNOSIS — M84372D Stress fracture, left ankle, subsequent encounter for fracture with routine healing: Secondary | ICD-10-CM | POA: Diagnosis not present

## 2023-11-24 DIAGNOSIS — M84372D Stress fracture, left ankle, subsequent encounter for fracture with routine healing: Secondary | ICD-10-CM | POA: Diagnosis not present

## 2023-11-24 DIAGNOSIS — R278 Other lack of coordination: Secondary | ICD-10-CM | POA: Diagnosis not present

## 2023-11-24 DIAGNOSIS — Z9181 History of falling: Secondary | ICD-10-CM | POA: Diagnosis not present

## 2023-11-25 DIAGNOSIS — M84372D Stress fracture, left ankle, subsequent encounter for fracture with routine healing: Secondary | ICD-10-CM | POA: Diagnosis not present

## 2023-11-25 DIAGNOSIS — Z9181 History of falling: Secondary | ICD-10-CM | POA: Diagnosis not present

## 2023-11-25 DIAGNOSIS — M6281 Muscle weakness (generalized): Secondary | ICD-10-CM | POA: Diagnosis not present

## 2023-11-28 DIAGNOSIS — M84372D Stress fracture, left ankle, subsequent encounter for fracture with routine healing: Secondary | ICD-10-CM | POA: Diagnosis not present

## 2023-11-28 DIAGNOSIS — M6281 Muscle weakness (generalized): Secondary | ICD-10-CM | POA: Diagnosis not present

## 2023-11-28 DIAGNOSIS — Z9181 History of falling: Secondary | ICD-10-CM | POA: Diagnosis not present

## 2023-11-28 DIAGNOSIS — R278 Other lack of coordination: Secondary | ICD-10-CM | POA: Diagnosis not present

## 2023-11-30 DIAGNOSIS — Z9181 History of falling: Secondary | ICD-10-CM | POA: Diagnosis not present

## 2023-11-30 DIAGNOSIS — M6281 Muscle weakness (generalized): Secondary | ICD-10-CM | POA: Diagnosis not present

## 2023-11-30 DIAGNOSIS — M84372D Stress fracture, left ankle, subsequent encounter for fracture with routine healing: Secondary | ICD-10-CM | POA: Diagnosis not present

## 2023-11-30 DIAGNOSIS — R278 Other lack of coordination: Secondary | ICD-10-CM | POA: Diagnosis not present

## 2023-12-02 ENCOUNTER — Encounter: Payer: Self-pay | Admitting: Orthopedic Surgery

## 2023-12-02 ENCOUNTER — Non-Acute Institutional Stay (SKILLED_NURSING_FACILITY): Admitting: Orthopedic Surgery

## 2023-12-02 DIAGNOSIS — R21 Rash and other nonspecific skin eruption: Secondary | ICD-10-CM

## 2023-12-02 DIAGNOSIS — Z9889 Other specified postprocedural states: Secondary | ICD-10-CM

## 2023-12-02 DIAGNOSIS — R278 Other lack of coordination: Secondary | ICD-10-CM | POA: Diagnosis not present

## 2023-12-02 DIAGNOSIS — Z8781 Personal history of (healed) traumatic fracture: Secondary | ICD-10-CM

## 2023-12-02 DIAGNOSIS — F339 Major depressive disorder, recurrent, unspecified: Secondary | ICD-10-CM

## 2023-12-02 DIAGNOSIS — M810 Age-related osteoporosis without current pathological fracture: Secondary | ICD-10-CM | POA: Diagnosis not present

## 2023-12-02 DIAGNOSIS — L299 Pruritus, unspecified: Secondary | ICD-10-CM | POA: Diagnosis not present

## 2023-12-02 DIAGNOSIS — Z9181 History of falling: Secondary | ICD-10-CM | POA: Diagnosis not present

## 2023-12-02 DIAGNOSIS — I10 Essential (primary) hypertension: Secondary | ICD-10-CM | POA: Diagnosis not present

## 2023-12-02 DIAGNOSIS — E039 Hypothyroidism, unspecified: Secondary | ICD-10-CM | POA: Diagnosis not present

## 2023-12-02 DIAGNOSIS — K219 Gastro-esophageal reflux disease without esophagitis: Secondary | ICD-10-CM

## 2023-12-02 DIAGNOSIS — M84372D Stress fracture, left ankle, subsequent encounter for fracture with routine healing: Secondary | ICD-10-CM | POA: Diagnosis not present

## 2023-12-02 DIAGNOSIS — M6281 Muscle weakness (generalized): Secondary | ICD-10-CM | POA: Diagnosis not present

## 2023-12-02 MED ORDER — HYDROXYZINE HCL 10 MG PO TABS
5.0000 mg | ORAL_TABLET | Freq: Three times a day (TID) | ORAL | Status: AC | PRN
Start: 2023-12-02 — End: ?

## 2023-12-02 NOTE — Progress Notes (Signed)
 Location:  [facility] Room Number: 35/A Place of Service:  SNF (31) Provider:  Arnetha Bhat, NP   Amy Feeling, MD  Patient Care Team: Amy Feeling, MD as PCP - General (Internal Medicine)  Extended Emergency Contact Information Primary Emergency Contact: Amy,Krause Address: [address]          [place] [address] [place] Mobile Phone: [phone] Relation: Spouse  Code Status:  DNR Goals of care: Advanced Directive information    09/21/2023   10:19 AM  Advanced Directives  Does Patient Have a Medical Advance Directive? Yes  Type of Estate agent of [place];Living will  Does patient want to make changes to medical advance directive? No - Patient declined  Copy of Healthcare Power of Attorney in Chart? No - copy requested     Chief Complaint  Patient presents with   Medical Management of Chronic Issues    HPI:  Pt is a 83 y.o. female seen today for medical management of chronic diseases.    She was admitted to [facility] 03/13. PMH: HTN, hypothyroidism, CKD, GERD, osteoporosis, h/o right breast cancer s/p lumpectomy, depression and anxiety.   Pruritus- unsuccessful trial of Zyrtec, remains on low dose hydroxyzine  prn> 10 mg recommended by Dr. Almeda Jacobs causing drowsiness  S/p ORIF left malleous ankle- 02/26 ORIF, cast placed, NWB x 6 weeks, ortho f/u 05/13, denies pain, no recent falls, working with PT/OT, completed asa for dvt prophylaxis HTN- remains on amlodipine Hypothyroidism- remains on levothyroxine  GERD- remains on omeprazole Depression- no changes in mood, supportive family, remains on sertraline  Osteoporosis- remains on calcium , vitamin D  and Evista   Progressing with PT/OT, [place] when appropriate.   Recent blood pressures:  05/06- 114/83  05/04- 150/95  04/29- 114/70  Recent weights:  05/01- 150 lbs  04/01- 149.5 lbs  03/13- 147.3 lbs  Past  Medical History:  Diagnosis Date   Anxiety    Takes Zoloft , follows w/ PCP Dr. Lysle Saunas at [facility].   Arthritis    knees   Cancer (HCC) 1991   right breast cancer, s/p chemotherapy and radiation   Dupuytren's contracture    left little finger   GERD (gastroesophageal reflux disease)    Follows with [facility], takes Omeprazole.   History of hiatal hernia    Hypothyroidism    Follows w/ PCP Dr.  Lysle Saunas at [facility].   Lupus    cutaneous, very mild   Multiple allergies    Takes weekly allergy shots as of 03/26/22.   Wears glasses    Wears hearing aid    right ear   Past Surgical History:  Procedure Laterality Date   BREAST LUMPECTOMY Right 1991   COLONOSCOPY     hx of several   CYSTOCELE REPAIR N/A 04/12/2022   Procedure: ANTERIOR REPAIR (CYSTOCELE);  Surgeon: Thurman Flores, MD;  Location: [facility];  Service: Gynecology;  Laterality: N/A;   DILATION AND CURETTAGE OF UTERUS  2011   MASTOIDECTOMY Bilateral    1991 and 2002   ORIF ANKLE FRACTURE Left 09/21/2023   Procedure: OPEN REDUCTION INTERNAL FIXATION (ORIF) LATERAL MALLEOLUS ANKLE FRACTURE;  Surgeon: Ali Ink, MD;  Location: [facility];  Service: Orthopedics;  Laterality: Left;  GENERAL AND BLOCK   RECTOCELE REPAIR N/A 04/12/2022   Procedure: POSTERIOR REPAIR (RECTOCELE);  Surgeon: Thurman Flores, MD;  Location: [facility];  Service: Gynecology;  Laterality: N/A;   SYNDESMOSIS REPAIR Left 09/21/2023   Procedure: POSSIBLE SYNDESMOSIS REPAIR;  Surgeon: Ali Ink, MD;  Location: [facility];  Service: Orthopedics;  Laterality: Left;   TONSILLECTOMY     as a child   TUBAL LIGATION  1974   TYMPANOMASTOIDECTOMY Right 05/08/2013   Procedure: RIGHT  CANAL-UP TYMPANOMASTOIDECTOMY; MICRODISSECTION USING THE OR MICROSCOPE; PLACEMENT OF PAPARELLA TUBE IN RIGHT EAR ;  Surgeon: Theador Finer, MD;  Location: [facility];  Service: ENT;  Laterality: Right;   TYMPANOSTOMY TUBE PLACEMENT     multiple times per pt   VAGINAL HYSTERECTOMY Bilateral 04/12/2022   Procedure: TOTAL VAGINAL HYSTERECTOMY;  Surgeon: Thurman Flores, MD;  Location: [facility];  Service: Gynecology;  Laterality: Bilateral;    Allergies  Allergen Reactions   Sulfa Antibiotics     Mouth blisters   Tape     Blisters skin    Outpatient Encounter Medications as of 12/02/2023  Medication Sig   amLODipine (NORVASC) 2.5 MG tablet Take 2.5 mg by mouth daily.   calcium -vitamin D  (OSCAL WITH D) 500-200 MG-UNIT per tablet Take 1 tablet by mouth daily with breakfast.   Cholecalciferol (VITAMIN D3) 25 MCG (1000 UT) CAPS Take by mouth. 3 tablets daily   clobetasol  cream (TEMOVATE ) 0.05 % Apply topically as needed.   cyanocobalamin  1000 MCG tablet Take 1,000 mcg by mouth 3 (three) times a week. M/W/F   hydrOXYzine  (ATARAX ) 10 MG tablet Take 0.5 tablets (5 mg total) by mouth 2 (two) times daily as needed.   levothyroxine  (SYNTHROID , LEVOTHROID) 75 MCG tablet Take 75 mcg by mouth daily before breakfast.   montelukast  (SINGULAIR ) 10 MG tablet Take 10 mg by mouth at bedtime.   Multiple Vitamins-Minerals (MULTIVITAMIN WITH MINERALS) tablet Take 1 tablet by mouth daily.   omeprazole (PRILOSEC) 40 MG capsule Take 40 mg by mouth daily.   polyethylene glycol powder (GLYCOLAX /MIRALAX ) 17 GM/SCOOP powder Take by mouth as needed.   raloxifene  (EVISTA ) 60 MG tablet Take 60 mg by mouth every evening.   sertraline  (ZOLOFT ) 50 MG tablet Take 50 mg by mouth daily.   triamcinolone  cream (KENALOG ) 0.1 % Apply 1 Application topically 2 (two) times daily for 3 days, THEN 1 Application 2 (two) times daily as needed.   No facility-administered encounter medications on file as of 12/02/2023.    Review of Systems  Constitutional: Negative.   HENT: Negative.    Eyes: Negative.   Respiratory: Negative.    Cardiovascular: Negative.    Gastrointestinal: Negative.   Genitourinary: Negative.   Musculoskeletal:  Positive for arthralgias and gait problem.  Skin:        itching  Neurological:  Positive for weakness.  Psychiatric/Behavioral:  Negative for confusion and dysphoric mood. The patient is not nervous/anxious.     Immunization History  Administered Date(s) Administered   PFIZER(Purple Top)SARS-COV-2 Vaccination 08/10/2019, 08/31/2019   Zoster Recombinant(Shingrix) 07/21/2018, 02/28/2019   Pertinent  Health Maintenance Due  Topic Date Due   INFLUENZA VACCINE  02/24/2024   DEXA SCAN  Completed      10/10/2023   12:14 PM  Fall Risk  Falls in the past year? 1  Was there an injury with Fall? 1  Fall Risk Category Calculator 2  Patient at Risk for Falls Due to History of fall(s);Impaired balance/gait  Fall risk Follow up Falls evaluation completed;Education provided   Functional Status Survey:    Vitals:   12/02/23 1516  BP: 114/83  Pulse: 90  Resp: 20  Temp: (!) 96.6 F (35.9 C)  SpO2: 97%  Weight: 150 lb (68 kg)  Height: 4\' 10"  (1.473 m)   Body mass index is 31.35 kg/m. Physical Exam Vitals reviewed.  Constitutional:      General: She is not in acute distress. HENT:     Head: Normocephalic.  Eyes:     General:        Right eye: No discharge.        Left eye: No discharge.  Cardiovascular:     Rate and Rhythm: Normal rate and regular rhythm.     Pulses: Normal pulses.     Heart sounds: Normal heart sounds.  Pulmonary:     Effort: Pulmonary effort is normal.     Breath sounds: Normal breath sounds.  Abdominal:     General: Bowel sounds are normal. There is no distension.     Palpations: Abdomen is soft.     Tenderness: There is no abdominal tenderness.  Musculoskeletal:     Cervical back: Neck supple.     Right lower leg: No edema.     Left lower leg: No edema.     Comments: Cam boot left foot  Skin:    General: Skin is warm.     Capillary Refill: Capillary refill takes less  than 2 seconds.     Findings: No rash.  Neurological:     General: No focal deficit present.     Mental Status: She is alert and oriented to person, place, and time.     Motor: Weakness present.     Gait: Gait abnormal.  Psychiatric:        Mood and Affect: Mood normal.     Labs reviewed: No results for input(s): "NA", "K", "CL", "CO2", "GLUCOSE", "BUN", "CREATININE", "CALCIUM ", "MG", "PHOS" in the last 8760 hours. No results for input(s): "AST", "ALT", "ALKPHOS", "BILITOT", "PROT", "ALBUMIN" in the last 8760 hours. No results for input(s): "WBC", "NEUTROABS", "HGB", "HCT", "MCV", "PLT" in the last 8760 hours. No results found for: "TSH" No results found for: "HGBA1C" No results found for: "CHOL", "HDL", "LDLCALC", "LDLDIRECT", "TRIG", "CHOLHDL"  Significant Diagnostic Results in last 30 days:  No results found.  Assessment/Plan 1. Pruritus (Primary) - ongoing - ? Laundry detergent at facility - unsuccessful trial Zyrtec - Dr. Almeda Jacobs recommended hydroxyzine  10 mg TID PRN - some drowsiness with 10 mg - will try hydroxyzine  5 mg or 10 mg   2. S/P ORIF (open reduction internal fixation) fracture - 02/14 mechanical fall - 02/17 xray confirmed fracture  - 02/26 ORIF lateral malleolus - NWB x 6 weeks - ortho f/u 04/15 - asa 81 mg BID x 42 days for DVT prophylaxis - no pain  - cont PT/OT  3. Primary hypertension - controlled with amlodipine  4. Hypothyroidism, unspecified type - cont levothyroxine   5. Gastroesophageal reflux disease without esophagitis - cont omeprazole  6. Depression, recurrent (HCC) - cont Zoloft   7. Senile osteoporosis - cont calcium , vitamin D  and Elavil     Family/ staff Communication: plan discussed with patient and nurse  Labs/tests ordered:  none

## 2023-12-05 DIAGNOSIS — M6281 Muscle weakness (generalized): Secondary | ICD-10-CM | POA: Diagnosis not present

## 2023-12-05 DIAGNOSIS — M84372D Stress fracture, left ankle, subsequent encounter for fracture with routine healing: Secondary | ICD-10-CM | POA: Diagnosis not present

## 2023-12-05 DIAGNOSIS — Z9181 History of falling: Secondary | ICD-10-CM | POA: Diagnosis not present

## 2023-12-06 DIAGNOSIS — S8262XD Displaced fracture of lateral malleolus of left fibula, subsequent encounter for closed fracture with routine healing: Secondary | ICD-10-CM | POA: Diagnosis not present

## 2023-12-08 DIAGNOSIS — M6281 Muscle weakness (generalized): Secondary | ICD-10-CM | POA: Diagnosis not present

## 2023-12-08 DIAGNOSIS — Z9181 History of falling: Secondary | ICD-10-CM | POA: Diagnosis not present

## 2023-12-08 DIAGNOSIS — M84372D Stress fracture, left ankle, subsequent encounter for fracture with routine healing: Secondary | ICD-10-CM | POA: Diagnosis not present

## 2023-12-09 DIAGNOSIS — M84372D Stress fracture, left ankle, subsequent encounter for fracture with routine healing: Secondary | ICD-10-CM | POA: Diagnosis not present

## 2023-12-09 DIAGNOSIS — M6281 Muscle weakness (generalized): Secondary | ICD-10-CM | POA: Diagnosis not present

## 2023-12-09 DIAGNOSIS — Z9181 History of falling: Secondary | ICD-10-CM | POA: Diagnosis not present

## 2023-12-15 DIAGNOSIS — J3089 Other allergic rhinitis: Secondary | ICD-10-CM | POA: Diagnosis not present

## 2023-12-15 DIAGNOSIS — J3081 Allergic rhinitis due to animal (cat) (dog) hair and dander: Secondary | ICD-10-CM | POA: Diagnosis not present

## 2023-12-15 DIAGNOSIS — J301 Allergic rhinitis due to pollen: Secondary | ICD-10-CM | POA: Diagnosis not present

## 2023-12-22 ENCOUNTER — Non-Acute Institutional Stay (SKILLED_NURSING_FACILITY): Payer: Self-pay | Admitting: Internal Medicine

## 2023-12-22 DIAGNOSIS — K219 Gastro-esophageal reflux disease without esophagitis: Secondary | ICD-10-CM | POA: Diagnosis not present

## 2023-12-22 DIAGNOSIS — F339 Major depressive disorder, recurrent, unspecified: Secondary | ICD-10-CM

## 2023-12-22 DIAGNOSIS — J3081 Allergic rhinitis due to animal (cat) (dog) hair and dander: Secondary | ICD-10-CM | POA: Diagnosis not present

## 2023-12-22 DIAGNOSIS — E039 Hypothyroidism, unspecified: Secondary | ICD-10-CM | POA: Diagnosis not present

## 2023-12-22 DIAGNOSIS — M810 Age-related osteoporosis without current pathological fracture: Secondary | ICD-10-CM

## 2023-12-22 DIAGNOSIS — I10 Essential (primary) hypertension: Secondary | ICD-10-CM

## 2023-12-22 DIAGNOSIS — R21 Rash and other nonspecific skin eruption: Secondary | ICD-10-CM

## 2023-12-22 DIAGNOSIS — Z9889 Other specified postprocedural states: Secondary | ICD-10-CM | POA: Diagnosis not present

## 2023-12-22 DIAGNOSIS — Z8781 Personal history of (healed) traumatic fracture: Secondary | ICD-10-CM

## 2023-12-22 DIAGNOSIS — J3089 Other allergic rhinitis: Secondary | ICD-10-CM | POA: Diagnosis not present

## 2023-12-22 DIAGNOSIS — J301 Allergic rhinitis due to pollen: Secondary | ICD-10-CM | POA: Diagnosis not present

## 2023-12-22 NOTE — Progress Notes (Signed)
 [facility]:  [facility] of Service:  SNF (31)  Provider:   PCP: Tena Feeling, MD Patient Care Team: Tena Feeling, MD as PCP - General (Internal Medicine)  Extended Emergency Contact Information Primary Emergency Contact: Tennell,[name] Address: [address]          [name] [address] [place] Mobile Phone: [phone] Relation: Spouse  Code Status:  Goals of care:  Advanced Directive information    09/21/2023   10:19 AM  Advanced Directives  Does Patient Have a Medical Advance Directive? Yes  Type of Estate agent of [place];Living will  Does patient want to make changes to medical advance directive? No - Patient declined  Copy of Healthcare Power of Attorney in Chart? No - copy requested     Allergies  Allergen Reactions   Sulfa Antibiotics     Mouth blisters   Tape     Blisters skin    Chief Complaint  Patient presents with   Discharge Note    HPI:  83 y.o. female  seen today for discharge  SNF for Rehab Patient had a mechanical fall on 02/17.  It showed mildly displaced fracture of Left distal fibular.  She underwent ORIF lateral malleolus ankle on 02/26.  She was nonweightbearing for 6 weeks.   Patient also has a history of hypertension, CKD, GERD and osteoporosis, depression and anxiety  Patient is doing well She is Weight Bearing. Walking with her walker Going back to her apartment with her husband OT and PT will continue to follow her She denied no pain   Past Medical History:  Diagnosis Date   Anxiety    Takes Zoloft , follows w/ PCP Dr. Lysle Saunas at [facility].   Arthritis    knees   Cancer (HCC) 1991   right breast cancer, s/p chemotherapy and radiation   Dupuytren's contracture    left little finger   GERD (gastroesophageal reflux disease)    Follows with [facility], takes Omeprazole.   History of hiatal hernia    Hypothyroidism    Follows w/ PCP Dr.  Lysle Saunas at [facility].   Lupus    cutaneous, very mild   Multiple allergies    Takes weekly allergy shots as of 03/26/22.   Wears glasses    Wears hearing aid    right ear    Past Surgical History:  Procedure Laterality Date   BREAST LUMPECTOMY Right 1991   COLONOSCOPY     hx of several   CYSTOCELE REPAIR N/A 04/12/2022   Procedure: ANTERIOR REPAIR (CYSTOCELE);  Surgeon: Thurman Flores, MD;  Location: [facility];  Service: Gynecology;  Laterality: N/A;   DILATION AND CURETTAGE OF UTERUS  2011   MASTOIDECTOMY Bilateral    1991 and 2002   ORIF ANKLE FRACTURE Left 09/21/2023   Procedure: OPEN REDUCTION INTERNAL FIXATION (ORIF) LATERAL MALLEOLUS ANKLE FRACTURE;  Surgeon: Ali Ink, MD;  Location: [facility];  Service: Orthopedics;  Laterality: Left;  GENERAL AND BLOCK   RECTOCELE REPAIR N/A 04/12/2022   Procedure: POSTERIOR REPAIR (RECTOCELE);  Surgeon: Thurman Flores, MD;  Location: [facility];  Service: Gynecology;  Laterality: N/A;   SYNDESMOSIS REPAIR Left 09/21/2023   Procedure: POSSIBLE SYNDESMOSIS REPAIR;  Surgeon: Ali Ink, MD;  Location: [facility];  Service: Orthopedics;  Laterality: Left;   TONSILLECTOMY     as a child   TUBAL LIGATION  1974   TYMPANOMASTOIDECTOMY  Right 05/08/2013   Procedure: RIGHT  CANAL-UP TYMPANOMASTOIDECTOMY; MICRODISSECTION USING THE OR MICROSCOPE; PLACEMENT OF PAPARELLA TUBE IN RIGHT EAR ;  Surgeon: Theador Finer, MD;  Location: [facility];  Service: ENT;  Laterality: Right;   TYMPANOSTOMY TUBE PLACEMENT     multiple times per pt   VAGINAL HYSTERECTOMY Bilateral 04/12/2022   Procedure: TOTAL VAGINAL HYSTERECTOMY;  Surgeon: Thurman Flores, MD;  Location: [facility];  Service: Gynecology;  Laterality: Bilateral;      reports that she has never smoked. She has never used smokeless tobacco. She reports that she does not drink alcohol and  does not use drugs. Social History   Socioeconomic History   Marital status: Married    Spouse name: Salbador Crate   Number of children: Not on file   Years of education: Not on file   Highest education level: Not on file  Occupational History   Not on file  Tobacco Use   Smoking status: Never   Smokeless tobacco: Never  Vaping Use   Vaping status: Never Used  Substance and Sexual Activity   Alcohol use: No   Drug use: Never   Sexual activity: Not on file  Other Topics Concern   Not on file  Social History Narrative   Not on file   Social Drivers of Health   Financial Resource Strain: Not on file  Food Insecurity: Low Risk  (11/10/2023)   Received from [facility]   Hunger Vital Sign    Worried About Running Out of Food in the Last Year: Never true    Ran Out of Food in the Last Year: Never true  Transportation Needs: No Transportation Needs (11/10/2023)   Received from [facility]    In the past 12 months, has lack of reliable transportation kept you from medical appointments, meetings, work or from getting things needed for daily living? : No  Physical Activity: Not on file  Stress: Not on file  Social Connections: Not on file  Intimate Partner Violence: Not on file   Functional Status Survey:    Allergies  Allergen Reactions   Sulfa Antibiotics     Mouth blisters   Tape     Blisters skin    Pertinent  Health Maintenance Due  Topic Date Due   INFLUENZA VACCINE  02/24/2024   DEXA SCAN  Completed    Medications: Outpatient Encounter Medications as of 12/22/2023  Medication Sig   amLODipine (NORVASC) 2.5 MG tablet Take 2.5 mg by mouth daily.   calcium -vitamin D  (OSCAL WITH D) 500-200 MG-UNIT per tablet Take 1 tablet by mouth daily with breakfast.   Cholecalciferol (VITAMIN D3) 25 MCG (1000 UT) CAPS Take by mouth. 3 tablets daily   clobetasol  cream (TEMOVATE ) 0.05 % Apply topically as needed.   cyanocobalamin  1000 MCG tablet Take 1,000 mcg by  mouth 3 (three) times a week. M/W/F   hydrOXYzine  (ATARAX ) 10 MG tablet Take 0.5-1 tablets (5-10 mg total) by mouth 3 (three) times daily as needed.   levothyroxine  (SYNTHROID , LEVOTHROID) 75 MCG tablet Take 75 mcg by mouth daily before breakfast.   montelukast  (SINGULAIR ) 10 MG tablet Take 10 mg by mouth at bedtime.   Multiple Vitamins-Minerals (MULTIVITAMIN WITH MINERALS) tablet Take 1 tablet by mouth daily.   omeprazole (PRILOSEC) 40 MG capsule Take 40 mg by mouth daily.   polyethylene glycol powder (GLYCOLAX /MIRALAX ) 17 GM/SCOOP powder Take by mouth as needed.   raloxifene  (EVISTA ) 60 MG tablet Take 60  mg by mouth every evening.   sertraline  (ZOLOFT ) 50 MG tablet Take 50 mg by mouth daily.   No facility-administered encounter medications on file as of 12/22/2023.    Review of Systems  Constitutional:  Negative for activity change and appetite change.  HENT: Negative.    Respiratory:  Negative for cough and shortness of breath.   Cardiovascular:  Negative for leg swelling.  Gastrointestinal:  Negative for constipation.  Genitourinary: Negative.   Musculoskeletal:  Negative for arthralgias, gait problem and myalgias.  Skin: Negative.   Neurological:  Negative for dizziness and weakness.  Psychiatric/Behavioral:  Negative for confusion, dysphoric mood and sleep disturbance.     There were no vitals filed for this visit. There is no height or weight on file to calculate BMI. Physical Exam Vitals reviewed.  Constitutional:      Appearance: Normal appearance.  HENT:     Head: Normocephalic.     Nose: Nose normal.     Mouth/Throat:     Mouth: Mucous membranes are moist.     Pharynx: Oropharynx is clear.  Eyes:     Pupils: Pupils are equal, round, and reactive to light.  Cardiovascular:     Rate and Rhythm: Normal rate and regular rhythm.     Pulses: Normal pulses.     Heart sounds: Normal heart sounds. No murmur heard. Pulmonary:     Effort: Pulmonary effort is normal.      Breath sounds: Normal breath sounds.  Abdominal:     General: Abdomen is flat. Bowel sounds are normal.     Palpations: Abdomen is soft.  Musculoskeletal:        General: No swelling.     Cervical back: Neck supple.  Skin:    General: Skin is warm.  Neurological:     General: No focal deficit present.     Mental Status: She is alert and oriented to person, place, and time.  Psychiatric:        Mood and Affect: Mood normal.        Thought Content: Thought content normal.     Labs reviewed: Basic Metabolic Panel: No results for input(s): "NA", "K", "CL", "CO2", "GLUCOSE", "BUN", "CREATININE", "CALCIUM ", "MG", "PHOS" in the last 8760 hours. Liver Function Tests: No results for input(s): "AST", "ALT", "ALKPHOS", "BILITOT", "PROT", "ALBUMIN" in the last 8760 hours. No results for input(s): "LIPASE", "AMYLASE" in the last 8760 hours. No results for input(s): "AMMONIA" in the last 8760 hours. CBC: No results for input(s): "WBC", "NEUTROABS", "HGB", "HCT", "MCV", "PLT" in the last 8760 hours. Cardiac Enzymes: No results for input(s): "CKTOTAL", "CKMB", "CKMBINDEX", "TROPONINI" in the last 8760 hours. BNP: Invalid input(s): "POCBNP" CBG: No results for input(s): "GLUCAP" in the last 8760 hours.  Procedures and Imaging Studies During Stay: No results found.  Assessment/Plan:   1. S/P ORIF (open reduction internal fixation) fracture (Primary) Walking with her walker Pain Controlled Continue therapy Will follow with Ortho  2. Primary hypertension Norvasc  3. Hypothyroidism, unspecified type Follow with PCP  4. Gastroesophageal reflux disease without esophagitis Prilosec  5. Depression, recurrent (HCC) Zoloft   6. Senile osteoporosis Calcium  Vit D and Raloxifene   7. Rash and nonspecific skin eruption Follows with Dr Almeda Jacobs 8 Urinary Incontinence Follow with PCP   Patient is being discharged with the following home health services:  Home therapy  Patient is being  discharged with the following durable medical equipment:  Walker  Patient has been advised to f/u with their PCP in 1-2 weeks  to for a transitions of care visit.   Future labs/tests needed:

## 2023-12-23 DIAGNOSIS — M84372D Stress fracture, left ankle, subsequent encounter for fracture with routine healing: Secondary | ICD-10-CM | POA: Diagnosis not present

## 2023-12-23 DIAGNOSIS — Z9181 History of falling: Secondary | ICD-10-CM | POA: Diagnosis not present

## 2023-12-23 DIAGNOSIS — M6281 Muscle weakness (generalized): Secondary | ICD-10-CM | POA: Diagnosis not present

## 2023-12-26 DIAGNOSIS — Z9181 History of falling: Secondary | ICD-10-CM | POA: Diagnosis not present

## 2023-12-26 DIAGNOSIS — M6281 Muscle weakness (generalized): Secondary | ICD-10-CM | POA: Diagnosis not present

## 2023-12-26 DIAGNOSIS — M84372D Stress fracture, left ankle, subsequent encounter for fracture with routine healing: Secondary | ICD-10-CM | POA: Diagnosis not present

## 2023-12-27 DIAGNOSIS — R262 Difficulty in walking, not elsewhere classified: Secondary | ICD-10-CM | POA: Diagnosis not present

## 2023-12-28 DIAGNOSIS — M6281 Muscle weakness (generalized): Secondary | ICD-10-CM | POA: Diagnosis not present

## 2023-12-28 DIAGNOSIS — Z9181 History of falling: Secondary | ICD-10-CM | POA: Diagnosis not present

## 2023-12-28 DIAGNOSIS — M84372D Stress fracture, left ankle, subsequent encounter for fracture with routine healing: Secondary | ICD-10-CM | POA: Diagnosis not present

## 2023-12-29 DIAGNOSIS — J301 Allergic rhinitis due to pollen: Secondary | ICD-10-CM | POA: Diagnosis not present

## 2023-12-29 DIAGNOSIS — R262 Difficulty in walking, not elsewhere classified: Secondary | ICD-10-CM | POA: Diagnosis not present

## 2023-12-29 DIAGNOSIS — J3089 Other allergic rhinitis: Secondary | ICD-10-CM | POA: Diagnosis not present

## 2023-12-29 DIAGNOSIS — J3081 Allergic rhinitis due to animal (cat) (dog) hair and dander: Secondary | ICD-10-CM | POA: Diagnosis not present

## 2023-12-30 DIAGNOSIS — N3281 Overactive bladder: Secondary | ICD-10-CM | POA: Diagnosis not present

## 2023-12-30 DIAGNOSIS — Z8781 Personal history of (healed) traumatic fracture: Secondary | ICD-10-CM | POA: Diagnosis not present

## 2024-01-02 DIAGNOSIS — Z9181 History of falling: Secondary | ICD-10-CM | POA: Diagnosis not present

## 2024-01-02 DIAGNOSIS — M84372D Stress fracture, left ankle, subsequent encounter for fracture with routine healing: Secondary | ICD-10-CM | POA: Diagnosis not present

## 2024-01-02 DIAGNOSIS — M6281 Muscle weakness (generalized): Secondary | ICD-10-CM | POA: Diagnosis not present

## 2024-01-03 DIAGNOSIS — R262 Difficulty in walking, not elsewhere classified: Secondary | ICD-10-CM | POA: Diagnosis not present

## 2024-01-04 DIAGNOSIS — M84372D Stress fracture, left ankle, subsequent encounter for fracture with routine healing: Secondary | ICD-10-CM | POA: Diagnosis not present

## 2024-01-04 DIAGNOSIS — R262 Difficulty in walking, not elsewhere classified: Secondary | ICD-10-CM | POA: Diagnosis not present

## 2024-01-04 DIAGNOSIS — Z9181 History of falling: Secondary | ICD-10-CM | POA: Diagnosis not present

## 2024-01-04 DIAGNOSIS — M6281 Muscle weakness (generalized): Secondary | ICD-10-CM | POA: Diagnosis not present

## 2024-01-06 DIAGNOSIS — J301 Allergic rhinitis due to pollen: Secondary | ICD-10-CM | POA: Diagnosis not present

## 2024-01-06 DIAGNOSIS — J3089 Other allergic rhinitis: Secondary | ICD-10-CM | POA: Diagnosis not present

## 2024-01-06 DIAGNOSIS — J3081 Allergic rhinitis due to animal (cat) (dog) hair and dander: Secondary | ICD-10-CM | POA: Diagnosis not present

## 2024-01-09 DIAGNOSIS — M6281 Muscle weakness (generalized): Secondary | ICD-10-CM | POA: Diagnosis not present

## 2024-01-09 DIAGNOSIS — M84372D Stress fracture, left ankle, subsequent encounter for fracture with routine healing: Secondary | ICD-10-CM | POA: Diagnosis not present

## 2024-01-09 DIAGNOSIS — Z9181 History of falling: Secondary | ICD-10-CM | POA: Diagnosis not present

## 2024-01-10 DIAGNOSIS — S8262XA Displaced fracture of lateral malleolus of left fibula, initial encounter for closed fracture: Secondary | ICD-10-CM | POA: Diagnosis not present

## 2024-01-11 DIAGNOSIS — R262 Difficulty in walking, not elsewhere classified: Secondary | ICD-10-CM | POA: Diagnosis not present

## 2024-01-13 DIAGNOSIS — J301 Allergic rhinitis due to pollen: Secondary | ICD-10-CM | POA: Diagnosis not present

## 2024-01-13 DIAGNOSIS — J3089 Other allergic rhinitis: Secondary | ICD-10-CM | POA: Diagnosis not present

## 2024-01-13 DIAGNOSIS — J3081 Allergic rhinitis due to animal (cat) (dog) hair and dander: Secondary | ICD-10-CM | POA: Diagnosis not present

## 2024-01-13 DIAGNOSIS — R262 Difficulty in walking, not elsewhere classified: Secondary | ICD-10-CM | POA: Diagnosis not present

## 2024-01-17 DIAGNOSIS — L930 Discoid lupus erythematosus: Secondary | ICD-10-CM | POA: Diagnosis not present

## 2024-01-17 DIAGNOSIS — D692 Other nonthrombocytopenic purpura: Secondary | ICD-10-CM | POA: Diagnosis not present

## 2024-01-17 DIAGNOSIS — L509 Urticaria, unspecified: Secondary | ICD-10-CM | POA: Diagnosis not present

## 2024-01-19 ENCOUNTER — Encounter (HOSPITAL_BASED_OUTPATIENT_CLINIC_OR_DEPARTMENT_OTHER): Payer: Self-pay | Admitting: Orthopaedic Surgery

## 2024-01-20 ENCOUNTER — Encounter (HOSPITAL_BASED_OUTPATIENT_CLINIC_OR_DEPARTMENT_OTHER)
Admission: RE | Admit: 2024-01-20 | Discharge: 2024-01-20 | Disposition: A | Discharge status: Home / Self Care | Source: Ambulatory Visit | Attending: Orthopaedic Surgery | Admitting: Orthopaedic Surgery

## 2024-01-20 ENCOUNTER — Other Ambulatory Visit: Payer: Self-pay

## 2024-01-20 DIAGNOSIS — J301 Allergic rhinitis due to pollen: Secondary | ICD-10-CM | POA: Diagnosis not present

## 2024-01-20 DIAGNOSIS — Z01818 Encounter for other preprocedural examination: Secondary | ICD-10-CM | POA: Insufficient documentation

## 2024-01-20 DIAGNOSIS — J3089 Other allergic rhinitis: Secondary | ICD-10-CM | POA: Diagnosis not present

## 2024-01-20 DIAGNOSIS — J3081 Allergic rhinitis due to animal (cat) (dog) hair and dander: Secondary | ICD-10-CM | POA: Diagnosis not present

## 2024-01-24 NOTE — Discharge Instructions (Signed)
 Netta Cedars, MD [facility]  Please read the following information regarding your care after surgery.  Medications  You only need a prescription for the narcotic pain medicine (ex. oxycodone, Percocet, Norco).  All of the other medicines listed below are available over the counter. ? Aleve 2 pills twice a day for the first 3 days after surgery. ? acetominophen (Tylenol) 650 mg every 4-6 hours as you need for minor to moderate pain ? oxycodone as prescribed for severe pain  ? To help prevent blood clots, take aspirin (81 mg) twice daily for 28 days after surgery.  You should also get up every hour while you are awake to move around.  Weight Bearing ? OK to walk on the operative leg only AFTER the nerve block has completely worn off.  Cast / Splint / Dressing ? Keep your dressing clean and dry.  Don't put anything (coat hanger, pencil, etc) down inside of it.  If it gets wet, please notify the office immediately.  Swelling IMPORTANT: It is normal for you to have swelling where you had surgery. To reduce swelling and pain, keep at least 3 pillows under your leg so that your toes are above your nose and your heel is above the level of your hip.  It may be necessary to keep your foot or leg elevated for several weeks.  This is critical to helping your incisions heal and your pain to feel better.  Follow Up Call my office at [phone] when you are discharged from the hospital or surgery center to schedule an appointment to be seen within 7-10 days after surgery.  Call my office at [phone] if you develop a fever >101.5 F, nausea, vomiting, bleeding from the surgical site or severe pain.   Post Anesthesia Home Care Instructions  Activity: Get plenty of rest for the remainder of the day. A responsible individual must stay with you for 24 hours following the procedure.  For the next 24 hours, DO NOT: -Drive a car -Advertising copywriter -Drink alcoholic beverages -Take any  medication unless instructed by your physician -Make any legal decisions or sign important papers.  Meals: Start with liquid foods such as gelatin or soup. Progress to regular foods as tolerated. Avoid greasy, spicy, heavy foods. If nausea and/or vomiting occur, drink only clear liquids until the nausea and/or vomiting subsides. Call your physician if vomiting continues.  Special Instructions/Symptoms: Your throat may feel dry or sore from the anesthesia or the breathing tube placed in your throat during surgery. If this causes discomfort, gargle with warm salt water. The discomfort should disappear within 24 hours.  If you had a scopolamine patch placed behind your ear for the management of post- operative nausea and/or vomiting:  1. The medication in the patch is effective for 72 hours, after which it should be removed.  Wrap patch in a tissue and discard in the trash. Wash hands thoroughly with soap and water. 2. You may remove the patch earlier than 72 hours if you experience unpleasant side effects which may include dry mouth, dizziness or visual disturbances. 3. Avoid touching the patch. Wash your hands with soap and water after contact with the patch.    Post Anesthesia Home Care Instructions  Activity: Get plenty of rest for the remainder of the day. A responsible individual must stay with you for 24 hours following the procedure.  For the next 24 hours, DO NOT: -Drive a car -Advertising copywriter -Drink alcoholic beverages -Take any medication unless instructed by your

## 2024-01-24 NOTE — H&P (Signed)
 ORTHOPAEDIC SURGERY H&P  Subjective:  The patient presents for left ankle removal of deep hardware (syndesmosis fixation) and possible revision syndesmosis dynamic fixation.   Past Medical History:  Diagnosis Date   Anxiety    Takes Zoloft , follows w/ PCP Dr. Norleen Lewis at [facility].   Arthritis    knees   Cancer (HCC) 1991   right breast cancer, s/p chemotherapy and radiation   Dupuytren's contracture    left little finger   GERD (gastroesophageal reflux disease)    Follows with [facility], takes Omeprazole.   History of hiatal hernia    Hypertension    Hypothyroidism    Follows w/ PCP Dr.  Norleen Lewis at [facility].   Lupus    cutaneous, very mild   Multiple allergies    Takes weekly allergy shots as of 03/26/22.   Wears glasses    Wears hearing aid    right ear    Past Surgical History:  Procedure Laterality Date   BREAST LUMPECTOMY Right 1991   COLONOSCOPY     hx of several   CYSTOCELE REPAIR N/A 04/12/2022   Procedure: ANTERIOR REPAIR (CYSTOCELE);  Surgeon: Mat Browning, MD;  Location: [facility];  Service: Gynecology;  Laterality: N/A;   DILATION AND CURETTAGE OF UTERUS  2011   MASTOIDECTOMY Bilateral    1991 and 2002   ORIF ANKLE FRACTURE Left 09/21/2023   Procedure: OPEN REDUCTION INTERNAL FIXATION (ORIF) LATERAL MALLEOLUS ANKLE FRACTURE;  Surgeon: Barton Drape, MD;  Location: [facility];  Service: Orthopedics;  Laterality: Left;  GENERAL AND BLOCK   RECTOCELE REPAIR N/A 04/12/2022   Procedure: POSTERIOR REPAIR (RECTOCELE);  Surgeon: Mat Browning, MD;  Location: [facility];  Service: Gynecology;  Laterality: N/A;   SYNDESMOSIS REPAIR Left 09/21/2023   Procedure: POSSIBLE SYNDESMOSIS REPAIR;  Surgeon: Barton Drape, MD;  Location: [facility];  Service: Orthopedics;  Laterality: Left;   TONSILLECTOMY     as a child   TUBAL LIGATION  1974    TYMPANOMASTOIDECTOMY Right 05/08/2013   Procedure: RIGHT  CANAL-UP TYMPANOMASTOIDECTOMY; MICRODISSECTION USING THE OR MICROSCOPE; PLACEMENT OF PAPARELLA TUBE IN RIGHT EAR ;  Surgeon: Camellia CHRISTELLA Milliner, MD;  Location: [facility];  Service: ENT;  Laterality: Right;   TYMPANOSTOMY TUBE PLACEMENT     multiple times per pt   VAGINAL HYSTERECTOMY Bilateral 04/12/2022   Procedure: TOTAL VAGINAL HYSTERECTOMY;  Surgeon: Mat Browning, MD;  Location: [facility];  Service: Gynecology;  Laterality: Bilateral;     (Not in an outpatient encounter)    Allergies  Allergen Reactions   Sulfa Antibiotics     Mouth blisters   Tape     Blisters skin    Social History   Socioeconomic History   Marital status: Married    Spouse name: Janifer   Number of children: Not on file   Years of education: Not on file   Highest education level: Not on file  Occupational History   Not on file  Tobacco Use   Smoking status: Never   Smokeless tobacco: Never  Vaping Use   Vaping status: Never Used  Substance and Sexual Activity   Alcohol use: No   Drug use: Never   Sexual activity: Not on file  Other Topics Concern   Not on file  Social History Narrative   Not on file   Social Drivers of Health   Financial Resource Strain: Not on file  Food Insecurity:  Low Risk  (11/10/2023)   Received from [facility]   Hunger Vital Sign    Within the past 12 months, you worried that your food would run out before you got money to buy more: Never true    Within the past 12 months, the food you bought just didn't last and you didn't have money to get more. : Never true  Transportation Needs: No Transportation Needs (11/10/2023)   Received from [facility]    In the past 12 months, has lack of reliable transportation kept you from medical appointments, meetings, work or from getting things needed for daily living? : No  Physical Activity: Not on file  Stress: Not on file   Social Connections: Not on file  Intimate Partner Violence: Not on file     History reviewed. No pertinent family history.   Review of Systems Pertinent items are noted in HPI.  Objective: Vital signs in last 24 hours:    01/19/2024    2:05 PM 12/02/2023    3:16 PM 11/17/2023   11:57 AM  Vitals with BMI  Height 4' 10 4' 10   Weight 150 lbs 150 lbs   BMI 31.36 31.36   Systolic  114 137  Diastolic  83 83  Pulse  90 77      EXAM: General: Well nourished, well developed. Awake, alert and oriented to time, place, person. Normal mood and affect. No apparent distress. Breathing room air.  Operative Lower Extremity: Alignment - Neutral Deformity - None Skin intact Tenderness to palpation - None 5/5 TA, PT, GS, Per, EHL, FHL Sensation intact to light touch throughout Palpable DP and PT pulses Special testing: None  The contralateral foot/ankle was examined for comparison and noted to be neurovascularly intact with no localized deformity, swelling, or tenderness.  Imaging Review All images taken were independently reviewed by me.  Assessment/Plan: The clinical and radiographic findings were reviewed and discussed at length with the patient.  The patient presents for left ankle removal of deep hardware (syndesmosis fixation) and possible revision syndesmosis dynamic fixation.  We spoke at length about the natural course of these findings. We discussed nonoperative and operative treatment options in detail.  The risks and benefits were presented and reviewed. The risks due to suture/hardware failure/irritation (or if removing hardware inability to remove part/all of hardware, recurrent instability), new/persistent/recurrent infection, stiffness, nerve/vessel/tendon injury, nonunion/malunion of any fracture, wound healing issues, allograft usage, development of arthritis, failure of this surgery, possibility of external fixation in certain situations, possibility of delayed  definitive surgery, need for further surgery, prolonged wound care including further soft tissue coverage procedures, thromboembolic events, anesthesia/medical complications/events perioperatively and beyond, amputation, death among others were discussed. The patient acknowledged the explanation and agreed to proceed with the plan.  Lillia Mountain  Orthopaedic Surgery [facility]

## 2024-01-25 ENCOUNTER — Ambulatory Visit (HOSPITAL_BASED_OUTPATIENT_CLINIC_OR_DEPARTMENT_OTHER)
Admission: RE | Admit: 2024-01-25 | Discharge: 2024-01-25 | Disposition: A | Discharge status: Home / Self Care | Attending: Orthopaedic Surgery | Admitting: Orthopaedic Surgery

## 2024-01-25 ENCOUNTER — Encounter (HOSPITAL_BASED_OUTPATIENT_CLINIC_OR_DEPARTMENT_OTHER): Payer: Self-pay | Admitting: Orthopaedic Surgery

## 2024-01-25 ENCOUNTER — Other Ambulatory Visit: Payer: Self-pay

## 2024-01-25 ENCOUNTER — Ambulatory Visit (HOSPITAL_BASED_OUTPATIENT_CLINIC_OR_DEPARTMENT_OTHER)

## 2024-01-25 ENCOUNTER — Ambulatory Visit (HOSPITAL_BASED_OUTPATIENT_CLINIC_OR_DEPARTMENT_OTHER): Admitting: Anesthesiology

## 2024-01-25 ENCOUNTER — Encounter (HOSPITAL_BASED_OUTPATIENT_CLINIC_OR_DEPARTMENT_OTHER)
Admission: RE | Disposition: A | Discharge status: Home / Self Care | Payer: Self-pay | Source: Home / Self Care | Attending: Orthopaedic Surgery

## 2024-01-25 DIAGNOSIS — Z01818 Encounter for other preprocedural examination: Secondary | ICD-10-CM

## 2024-01-25 DIAGNOSIS — X58XXXA Exposure to other specified factors, initial encounter: Secondary | ICD-10-CM | POA: Diagnosis not present

## 2024-01-25 DIAGNOSIS — K449 Diaphragmatic hernia without obstruction or gangrene: Secondary | ICD-10-CM | POA: Insufficient documentation

## 2024-01-25 DIAGNOSIS — F419 Anxiety disorder, unspecified: Secondary | ICD-10-CM | POA: Insufficient documentation

## 2024-01-25 DIAGNOSIS — Z472 Encounter for removal of internal fixation device: Secondary | ICD-10-CM

## 2024-01-25 DIAGNOSIS — K219 Gastro-esophageal reflux disease without esophagitis: Secondary | ICD-10-CM | POA: Insufficient documentation

## 2024-01-25 DIAGNOSIS — E039 Hypothyroidism, unspecified: Secondary | ICD-10-CM

## 2024-01-25 DIAGNOSIS — I10 Essential (primary) hypertension: Secondary | ICD-10-CM | POA: Insufficient documentation

## 2024-01-25 DIAGNOSIS — S93432A Sprain of tibiofibular ligament of left ankle, initial encounter: Secondary | ICD-10-CM | POA: Diagnosis not present

## 2024-01-25 DIAGNOSIS — S8262XA Displaced fracture of lateral malleolus of left fibula, initial encounter for closed fracture: Secondary | ICD-10-CM

## 2024-01-25 HISTORY — DX: Essential (primary) hypertension: I10

## 2024-01-25 HISTORY — PX: HARDWARE REMOVAL: SHX979

## 2024-01-25 HISTORY — PX: SYNDESMOSIS REPAIR: SHX5182

## 2024-01-25 SURGERY — REMOVAL, HARDWARE
Anesthesia: General | Site: Ankle | Laterality: Left

## 2024-01-25 MED ORDER — DEXAMETHASONE SODIUM PHOSPHATE 10 MG/ML IJ SOLN
INTRAMUSCULAR | Status: DC | PRN
  Administered 2024-01-25: 8 mg via INTRAVENOUS

## 2024-01-25 MED ORDER — BUPIVACAINE-EPINEPHRINE (PF) 0.5% -1:200000 IJ SOLN
INTRAMUSCULAR | Status: AC
  Filled 2024-01-25: qty 30

## 2024-01-25 MED ORDER — FENTANYL CITRATE (PF) 100 MCG/2ML IJ SOLN: 25.0000 ug | INTRAMUSCULAR | Status: DC | PRN

## 2024-01-25 MED ORDER — OXYCODONE HCL 5 MG/5ML PO SOLN: 5.0000 mg | Freq: Once | ORAL | Status: DC | PRN

## 2024-01-25 MED ORDER — PROPOFOL 10 MG/ML IV BOLUS: INTRAVENOUS | Status: DC | PRN

## 2024-01-25 MED ORDER — CEFAZOLIN SODIUM-DEXTROSE 2-4 GM/100ML-% IV SOLN
2.0000 g | INTRAVENOUS | Status: AC
  Administered 2024-01-25: 2 g via INTRAVENOUS

## 2024-01-25 MED ORDER — ONDANSETRON HCL 4 MG/2ML IJ SOLN
INTRAMUSCULAR | Status: AC
Start: 2024-01-25 — End: 2024-01-25
  Filled 2024-01-25: qty 2

## 2024-01-25 MED ORDER — PROPOFOL 10 MG/ML IV BOLUS
INTRAVENOUS | Status: DC | PRN
  Administered 2024-01-25: 50 mg via INTRAVENOUS
  Administered 2024-01-25: 150 mg via INTRAVENOUS

## 2024-01-25 MED ORDER — KETOROLAC TROMETHAMINE 30 MG/ML IJ SOLN
INTRAMUSCULAR | Status: AC
  Filled 2024-01-25: qty 1

## 2024-01-25 MED ORDER — CEFAZOLIN SODIUM-DEXTROSE 2-4 GM/100ML-% IV SOLN
INTRAVENOUS | Status: AC
  Filled 2024-01-25: qty 100

## 2024-01-25 MED ORDER — KETOROLAC TROMETHAMINE 30 MG/ML IJ SOLN
INTRAMUSCULAR | Status: DC | PRN
Start: 2024-01-25 — End: 2024-01-25
  Administered 2024-01-25: 15 mg via INTRAVENOUS

## 2024-01-25 MED ORDER — FENTANYL CITRATE (PF) 100 MCG/2ML IJ SOLN
INTRAMUSCULAR | Status: AC
Start: 2024-01-25 — End: 2024-01-25
  Filled 2024-01-25: qty 2

## 2024-01-25 MED ORDER — ONDANSETRON HCL 4 MG/2ML IJ SOLN: 4.0000 mg | Freq: Once | INTRAMUSCULAR | Status: DC | PRN

## 2024-01-25 MED ORDER — LACTATED RINGERS IV SOLN: INTRAVENOUS | Status: DC

## 2024-01-25 MED ORDER — FENTANYL CITRATE (PF) 100 MCG/2ML IJ SOLN
INTRAMUSCULAR | Status: DC | PRN
  Administered 2024-01-25 (×2): 50 ug via INTRAVENOUS

## 2024-01-25 MED ORDER — OXYCODONE HCL 5 MG PO TABS: 5.0000 mg | ORAL_TABLET | Freq: Once | ORAL | Status: DC | PRN

## 2024-01-25 MED ORDER — LIDOCAINE 2% (20 MG/ML) 5 ML SYRINGE
INTRAMUSCULAR | Status: DC | PRN
  Administered 2024-01-25: 40 mg via INTRAVENOUS
  Administered 2024-01-25: 20 mg via INTRAVENOUS

## 2024-01-25 MED ORDER — LACTATED RINGERS IV SOLN: INTRAVENOUS | Status: DC | PRN

## 2024-01-25 MED ORDER — BUPIVACAINE-EPINEPHRINE 0.5% -1:200000 IJ SOLN
INTRAMUSCULAR | Status: DC | PRN
  Administered 2024-01-25: 10 mL

## 2024-01-25 MED ORDER — DEXAMETHASONE SODIUM PHOSPHATE 10 MG/ML IJ SOLN
INTRAMUSCULAR | Status: AC
Start: 2024-01-25 — End: 2024-01-25
  Filled 2024-01-25: qty 1

## 2024-01-25 MED ORDER — BUPIVACAINE-EPINEPHRINE (PF) 0.25% -1:200000 IJ SOLN
INTRAMUSCULAR | Status: AC
  Filled 2024-01-25: qty 30

## 2024-01-25 MED ORDER — SUCCINYLCHOLINE CHLORIDE 200 MG/10ML IV SOSY
PREFILLED_SYRINGE | INTRAVENOUS | Status: DC | PRN
Start: 2024-01-25 — End: 2024-01-25
  Administered 2024-01-25: 80 mg via INTRAVENOUS

## 2024-01-25 MED ORDER — CHLORHEXIDINE GLUCONATE 4 % EX SOLN: 60.0000 mL | Freq: Once | CUTANEOUS | Status: DC

## 2024-01-25 MED ORDER — LIDOCAINE 2% (20 MG/ML) 5 ML SYRINGE
INTRAMUSCULAR | Status: AC
  Filled 2024-01-25: qty 5

## 2024-01-25 MED ORDER — DEXMEDETOMIDINE HCL IN NACL 80 MCG/20ML IV SOLN
INTRAVENOUS | Status: DC | PRN
  Administered 2024-01-25: 8 ug via INTRAVENOUS

## 2024-01-25 MED ORDER — ONDANSETRON HCL 4 MG/2ML IJ SOLN
INTRAMUSCULAR | Status: DC | PRN
  Administered 2024-01-25: 4 mg via INTRAVENOUS

## 2024-01-25 SURGICAL SUPPLY — 48 items
BLADE SURG 15 STRL LF DISP TIS (BLADE) ×4 IMPLANT
BNDG COHESIVE 4X5 TAN STRL LF (GAUZE/BANDAGES/DRESSINGS) ×1 IMPLANT
BNDG ELASTIC 4INX 5YD STR LF (GAUZE/BANDAGES/DRESSINGS) ×1 IMPLANT
BNDG ELASTIC 6INX 5YD STR LF (GAUZE/BANDAGES/DRESSINGS) IMPLANT
BNDG GAUZE DERMACEA FLUFF 4 (GAUZE/BANDAGES/DRESSINGS) ×1 IMPLANT
BRUSH SCRUB EZ 4% CHG (MISCELLANEOUS) ×1 IMPLANT
CANISTER SUCT 1200ML W/VALVE (MISCELLANEOUS) ×1 IMPLANT
CHLORAPREP W/TINT 26 (MISCELLANEOUS) ×1 IMPLANT
COVER BACK TABLE 60X90IN (DRAPES) ×1 IMPLANT
CUFF TRNQT CYL 34X4.125X (TOURNIQUET CUFF) IMPLANT
DRAPE C-ARM 42X72 X-RAY (DRAPES) IMPLANT
DRAPE C-ARMOR (DRAPES) IMPLANT
DRAPE EXTREMITY T 121X128X90 (DISPOSABLE) ×1 IMPLANT
DRAPE IMP U-DRAPE 54X76 (DRAPES) ×1 IMPLANT
DRAPE OEC MINIVIEW 54X84 (DRAPES) IMPLANT
DRAPE U-SHAPE 47X51 STRL (DRAPES) ×1 IMPLANT
DRSG MEPITEL 4X7.2 (GAUZE/BANDAGES/DRESSINGS) ×1 IMPLANT
ELECTRODE REM PT RTRN 9FT ADLT (ELECTROSURGICAL) ×1 IMPLANT
FIXATION ZIPTIGHT ANKLE SNDSMS (Ankle) IMPLANT
GAUZE PAD ABD 8X10 STRL (GAUZE/BANDAGES/DRESSINGS) IMPLANT
GAUZE SPONGE 4X4 12PLY STRL (GAUZE/BANDAGES/DRESSINGS) ×1 IMPLANT
GLOVE BIOGEL PI IND STRL 8 (GLOVE) ×1 IMPLANT
GLOVE SURG SS PI 7.5 STRL IVOR (GLOVE) ×1 IMPLANT
GOWN STRL REUS W/ TWL LRG LVL3 (GOWN DISPOSABLE) ×2 IMPLANT
MARKER SKIN DUAL TIP RULER LAB (MISCELLANEOUS) IMPLANT
NDL HYPO 25X1 1.5 SAFETY (NEEDLE) IMPLANT
NEEDLE HYPO 25X1 1.5 SAFETY (NEEDLE) IMPLANT
NS IRRIG 1000ML POUR BTL (IV SOLUTION) ×1 IMPLANT
PACK BASIN DAY SURGERY FS (CUSTOM PROCEDURE TRAY) ×1 IMPLANT
PADDING CAST SYNTHETIC 4X4 STR (CAST SUPPLIES) IMPLANT
PENCIL SMOKE EVACUATOR (MISCELLANEOUS) IMPLANT
SHEET MEDIUM DRAPE 40X70 STRL (DRAPES) ×1 IMPLANT
SLEEVE SCD COMPRESS KNEE MED (STOCKING) ×1 IMPLANT
SPIKE FLUID TRANSFER (MISCELLANEOUS) IMPLANT
SPONGE T-LAP 18X18 ~~LOC~~+RFID (SPONGE) ×1 IMPLANT
STOCKINETTE 6 STRL (DRAPES) IMPLANT
STOCKINETTE ORTHO 6X25 (MISCELLANEOUS) ×1 IMPLANT
SUCTION TUBE FRAZIER 10FR DISP (SUCTIONS) IMPLANT
SUT ETHILON 2 0 FS 18 (SUTURE) ×2 IMPLANT
SUT MNCRL AB 3-0 PS2 18 (SUTURE) IMPLANT
SUT VIC AB 0 CT1 27XBRD ANBCTR (SUTURE) IMPLANT
SUT VIC AB 2-0 CT1 TAPERPNT 27 (SUTURE) IMPLANT
SUT VIC AB 3-0 SH 27X BRD (SUTURE) IMPLANT
SYR BULB IRRIG 60ML STRL (SYRINGE) ×1 IMPLANT
SYR CONTROL 10ML LL (SYRINGE) IMPLANT
TOWEL GREEN STERILE FF (TOWEL DISPOSABLE) ×2 IMPLANT
TUBE CONNECTING 20X1/4 (TUBING) IMPLANT
UNDERPAD 30X36 HEAVY ABSORB (UNDERPADS AND DIAPERS) ×1 IMPLANT

## 2024-01-25 NOTE — Op Note (Signed)
 01/25/2024  12:56 PM   PATIENT: Amy Krause  83 y.o. female  MRN: [id number]   PRE-OPERATIVE DIAGNOSIS:   Closed fracture of lateral malleolus of left fibula s/p ORIF   POST-OPERATIVE DIAGNOSIS:   Same   PROCEDURE: 1] Left ankle removal of deep hardware (syndesmosis fixation) 2] Left ankle syndesmosis open reduction internal fixation with dynamic device   SURGEON:  Lillia Mountain, MD   ASSISTANT: None   ANESTHESIA: General, regional   EBL: Minimal   TOURNIQUET:    Total Tourniquet Time Documented: Thigh (Left) - 13 minutes Total: Thigh (Left) - 13 minutes    COMPLICATIONS: None apparent   DISPOSITION: Extubated, awake and stable to recovery.   INDICATION FOR PROCEDURE: The patient presented with above diagnosis.  We discussed the diagnosis, alternative treatment options, risks and benefits of the above surgical intervention, as well as alternative non-operative treatments. All questions/concerns were addressed and the patient/family demonstrated appropriate understanding of the diagnosis, the procedure, the postoperative course, and overall prognosis. The patient wished to proceed with surgical intervention and signed an informed surgical consent as such, in each others presence prior to surgery.   PROCEDURE IN DETAIL: After preoperative consent was obtained and the correct operative site was identified, the patient was brought to the operating room supine on stretcher and transferred onto operating table. General anesthesia was induced. Preoperative antibiotics were administered. Surgical timeout was taken. The patient was then positioned supine with an ipsilateral hip bump. The operative lower extremity was prepped and draped in standard sterile fashion with a tourniquet around the thigh. The extremity was exsanguinated and the tourniquet was inflated to 275 mmHg.  Prior lateralapproach was utilized over the distal fibula and dissection carried down to  the level of plate. The syndesmosis screw was removed completely. Next, a suture fixation system (ZipTight device) was implanted through the fibula plate in cannulated fashion to fix the syndesmosis. Anchor/button position was verified along anteromedial tibial cortex by fluoroscopy. A repeat stress radiograph showed complete stability of the ankle mortise to testing.   The surgical sites were thoroughly irrigated. The tourniquet was deflated and hemostasis achieved. Betadine  solution was used to irrigate and vancomycin  powder applied. The skin was closed without tension.    The leg was cleaned with saline and sterile mepitel dressings with gauze were applied. A well padded sterile wrap was applied. The patient was awakened from anesthesia and transported to the recovery room in stable condition.    FOLLOW UP PLAN: -transfer to PACU, then home -strict NWB operative extremity until nerve block wears off, then OK to WBAT, maximum elevation -maintain dressings until follow up -DVT ppx: Aspirin 81 mg twice daily while NWB -follow up as outpatient within 7-10 days for wound check -sutures out in 2-3 weeks in outpatient office   RADIOGRAPHS: AP, lateral, oblique and stress radiographs of the left ankle were obtained intraoperatively. These showed interval removal of the syndesmosis screw and revision syndesmosis fixation. Manual stress radiographs were taken and the joints were noted to be stable following procedure. No other acute injuries are noted.   Lillia Mountain Orthopaedic Surgery [facility]

## 2024-01-25 NOTE — Anesthesia Preprocedure Evaluation (Addendum)
 Anesthesia Evaluation  Patient identified by MRN, date of birth, ID band Patient awake    Reviewed: Allergy & Precautions, NPO status , Patient's Chart, lab work & pertinent test results  Airway Mallampati: I  TM Distance: >3 FB     Dental no notable dental hx. (+) Dental Advisory Given   Pulmonary neg pulmonary ROS   Pulmonary exam normal breath sounds clear to auscultation       Cardiovascular hypertension, Pt. 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 Syndesmosis left ankle   Abdominal  (+) + obese  Peds  Hematology negative hematology ROS (+)   Anesthesia Other Findings   Reproductive/Obstetrics                              Anesthesia Physical Anesthesia Plan  ASA: 3  Anesthesia Plan: General   Post-op Pain Management: Minimal or no pain anticipated   Induction: Intravenous  PONV Risk Score and Plan: 4 or greater and Treatment Ellianne vary due to age or medical condition, Ondansetron  and Dexamethasone   Airway Management Planned: LMA  Additional Equipment: None  Intra-op Plan:   Post-operative Plan: Extubation in OR  Informed Consent: I have reviewed the patients History and Physical, chart, labs and discussed the procedure including the risks, benefits and alternatives for the proposed anesthesia with the patient or authorized representative who has indicated his/her understanding and acceptance.     Dental advisory given  Plan Discussed with: Anesthesiologist and CRNA  Anesthesia Plan  Comments:          Anesthesia Quick Evaluation

## 2024-01-25 NOTE — Transfer of Care (Signed)
 Immediate Anesthesia Transfer of Care Note  Patient: Amy Krause  Procedure(s) Performed: REMOVAL, HARDWARE (Left: Ankle) REPAIR, SYNDESMOSIS, ANKLE (Left: Ankle)  Patient Location: PACU  Anesthesia Type:General  Level of Consciousness: awake, alert , oriented, and patient cooperative  Airway & Oxygen Therapy: Patient Spontanous Breathing and Patient connected to face mask oxygen  Post-op Assessment: Report given to RN and Post -op Vital signs reviewed and stable  Post vital signs: Reviewed and stable  Last Vitals:  Vitals Value Taken Time  BP 110/59 01/25/24 11:21  Temp 36.1 C 01/25/24 11:21  Pulse 66 01/25/24 11:29  Resp 22 01/25/24 11:29  SpO2 97 % 01/25/24 11:29  Vitals shown include unfiled device data.  Last Pain:  Vitals:   01/25/24 0912  TempSrc: Temporal  PainSc: 0-No pain         Complications: No notable events documented.

## 2024-01-25 NOTE — Anesthesia Procedure Notes (Signed)
 Procedure Name: Intubation Date/Time: 01/25/2024 10:52 AM  Performed by: Denton Niels CROME, CRNAPre-anesthesia Checklist: Patient identified, Emergency Drugs available, Suction available and Patient being monitored Patient Re-evaluated:Patient Re-evaluated prior to induction Oxygen Delivery Method: Circle system utilized Preoxygenation: Pre-oxygenation with 100% oxygen Induction Type: Combination inhalational/ intravenous induction Ventilation: Mask ventilation without difficulty Laryngoscope Size: Mac and 3 Grade View: Grade I Tube type: Oral Tube size: 6.5 mm Number of attempts: 1 Airway Equipment and Method: Stylet Placement Confirmation: ETT inserted through vocal cords under direct vision, positive ETCO2 and breath sounds checked- equal and bilateral Secured at: 22 cm Tube secured with: Tape Dental Injury: Teeth and Oropharynx as per pre-operative assessment

## 2024-01-25 NOTE — Anesthesia Postprocedure Evaluation (Signed)
 Anesthesia Post Note  Patient: Amy Krause  Procedure(s) Performed: REMOVAL, HARDWARE (Left: Ankle) REPAIR, SYNDESMOSIS, ANKLE (Left: Ankle)     Patient location during evaluation: PACU Anesthesia Type: General Level of consciousness: awake and alert and oriented Pain management: pain level controlled Vital Signs Assessment: post-procedure vital signs reviewed and stable Respiratory status: spontaneous breathing, nonlabored ventilation and respiratory function stable Cardiovascular status: blood pressure returned to baseline and stable Postop Assessment: no apparent nausea or vomiting Anesthetic complications: no   No notable events documented.  Last Vitals:  Vitals:   01/25/24 1130 01/25/24 1145  BP: 109/65 102/71  Pulse: 66 63  Resp: 20 15  Temp:    SpO2: 97% 92%    Last Pain:  Vitals:   01/25/24 1145  TempSrc:   PainSc: 0-No pain    LLE Motor Response: Purposeful movement;Responds to commands (01/25/24 1145) LLE Sensation: Full sensation (01/25/24 1145)          Jaki Steptoe A.

## 2024-01-26 ENCOUNTER — Encounter (HOSPITAL_BASED_OUTPATIENT_CLINIC_OR_DEPARTMENT_OTHER): Payer: Self-pay | Admitting: Orthopaedic Surgery

## 2024-01-30 DIAGNOSIS — J3081 Allergic rhinitis due to animal (cat) (dog) hair and dander: Secondary | ICD-10-CM | POA: Diagnosis not present

## 2024-01-30 DIAGNOSIS — J301 Allergic rhinitis due to pollen: Secondary | ICD-10-CM | POA: Diagnosis not present

## 2024-01-30 DIAGNOSIS — J3089 Other allergic rhinitis: Secondary | ICD-10-CM | POA: Diagnosis not present

## 2024-02-09 DIAGNOSIS — J3081 Allergic rhinitis due to animal (cat) (dog) hair and dander: Secondary | ICD-10-CM | POA: Diagnosis not present

## 2024-02-09 DIAGNOSIS — J3089 Other allergic rhinitis: Secondary | ICD-10-CM | POA: Diagnosis not present

## 2024-02-09 DIAGNOSIS — J301 Allergic rhinitis due to pollen: Secondary | ICD-10-CM | POA: Diagnosis not present

## 2024-02-14 DIAGNOSIS — E039 Hypothyroidism, unspecified: Secondary | ICD-10-CM | POA: Diagnosis not present

## 2024-02-14 DIAGNOSIS — I10 Essential (primary) hypertension: Secondary | ICD-10-CM | POA: Diagnosis not present

## 2024-02-14 DIAGNOSIS — K219 Gastro-esophageal reflux disease without esophagitis: Secondary | ICD-10-CM | POA: Diagnosis not present

## 2024-02-14 DIAGNOSIS — Z9889 Other specified postprocedural states: Secondary | ICD-10-CM | POA: Diagnosis not present

## 2024-02-14 DIAGNOSIS — N3281 Overactive bladder: Secondary | ICD-10-CM | POA: Diagnosis not present

## 2024-02-14 DIAGNOSIS — N1831 Chronic kidney disease, stage 3a: Secondary | ICD-10-CM | POA: Diagnosis not present

## 2024-02-16 DIAGNOSIS — S8262XD Displaced fracture of lateral malleolus of left fibula, subsequent encounter for closed fracture with routine healing: Secondary | ICD-10-CM | POA: Diagnosis not present

## 2024-02-23 DIAGNOSIS — J3081 Allergic rhinitis due to animal (cat) (dog) hair and dander: Secondary | ICD-10-CM | POA: Diagnosis not present

## 2024-02-23 DIAGNOSIS — J3089 Other allergic rhinitis: Secondary | ICD-10-CM | POA: Diagnosis not present

## 2024-02-23 DIAGNOSIS — J301 Allergic rhinitis due to pollen: Secondary | ICD-10-CM | POA: Diagnosis not present

## 2024-03-02 DIAGNOSIS — H90A31 Mixed conductive and sensorineural hearing loss, unilateral, right ear with restricted hearing on the contralateral side: Secondary | ICD-10-CM | POA: Diagnosis not present

## 2024-03-02 DIAGNOSIS — Z4589 Encounter for adjustment and management of other implanted devices: Secondary | ICD-10-CM | POA: Diagnosis not present

## 2024-03-02 DIAGNOSIS — H9111 Presbycusis, right ear: Secondary | ICD-10-CM | POA: Diagnosis not present

## 2024-03-02 DIAGNOSIS — H608X2 Other otitis externa, left ear: Secondary | ICD-10-CM | POA: Diagnosis not present

## 2024-03-07 DIAGNOSIS — J3089 Other allergic rhinitis: Secondary | ICD-10-CM | POA: Diagnosis not present

## 2024-03-07 DIAGNOSIS — J301 Allergic rhinitis due to pollen: Secondary | ICD-10-CM | POA: Diagnosis not present

## 2024-03-13 DIAGNOSIS — J301 Allergic rhinitis due to pollen: Secondary | ICD-10-CM | POA: Diagnosis not present

## 2024-03-13 DIAGNOSIS — J3089 Other allergic rhinitis: Secondary | ICD-10-CM | POA: Diagnosis not present

## 2024-03-13 DIAGNOSIS — J3081 Allergic rhinitis due to animal (cat) (dog) hair and dander: Secondary | ICD-10-CM | POA: Diagnosis not present

## 2024-03-28 DIAGNOSIS — J3089 Other allergic rhinitis: Secondary | ICD-10-CM | POA: Diagnosis not present

## 2024-03-28 DIAGNOSIS — J301 Allergic rhinitis due to pollen: Secondary | ICD-10-CM | POA: Diagnosis not present

## 2024-03-28 DIAGNOSIS — J3081 Allergic rhinitis due to animal (cat) (dog) hair and dander: Secondary | ICD-10-CM | POA: Diagnosis not present

## 2024-04-03 DIAGNOSIS — N3281 Overactive bladder: Secondary | ICD-10-CM | POA: Diagnosis not present

## 2024-04-05 DIAGNOSIS — E039 Hypothyroidism, unspecified: Secondary | ICD-10-CM | POA: Diagnosis not present

## 2024-04-05 DIAGNOSIS — H906 Mixed conductive and sensorineural hearing loss, bilateral: Secondary | ICD-10-CM | POA: Diagnosis not present

## 2024-04-12 DIAGNOSIS — J3089 Other allergic rhinitis: Secondary | ICD-10-CM | POA: Diagnosis not present

## 2024-04-12 DIAGNOSIS — J301 Allergic rhinitis due to pollen: Secondary | ICD-10-CM | POA: Diagnosis not present

## 2024-04-12 DIAGNOSIS — J3081 Allergic rhinitis due to animal (cat) (dog) hair and dander: Secondary | ICD-10-CM | POA: Diagnosis not present

## 2024-04-19 DIAGNOSIS — J301 Allergic rhinitis due to pollen: Secondary | ICD-10-CM | POA: Diagnosis not present

## 2024-04-19 DIAGNOSIS — J3089 Other allergic rhinitis: Secondary | ICD-10-CM | POA: Diagnosis not present

## 2024-04-19 DIAGNOSIS — J3081 Allergic rhinitis due to animal (cat) (dog) hair and dander: Secondary | ICD-10-CM | POA: Diagnosis not present

## 2024-04-26 DIAGNOSIS — J3089 Other allergic rhinitis: Secondary | ICD-10-CM | POA: Diagnosis not present

## 2024-04-26 DIAGNOSIS — J301 Allergic rhinitis due to pollen: Secondary | ICD-10-CM | POA: Diagnosis not present

## 2024-04-26 DIAGNOSIS — J3081 Allergic rhinitis due to animal (cat) (dog) hair and dander: Secondary | ICD-10-CM | POA: Diagnosis not present

## 2024-05-10 DIAGNOSIS — J301 Allergic rhinitis due to pollen: Secondary | ICD-10-CM | POA: Diagnosis not present

## 2024-05-10 DIAGNOSIS — J3089 Other allergic rhinitis: Secondary | ICD-10-CM | POA: Diagnosis not present

## 2024-05-15 DIAGNOSIS — S8262XA Displaced fracture of lateral malleolus of left fibula, initial encounter for closed fracture: Secondary | ICD-10-CM | POA: Diagnosis not present

## 2024-05-17 DIAGNOSIS — J3089 Other allergic rhinitis: Secondary | ICD-10-CM | POA: Diagnosis not present

## 2024-05-17 DIAGNOSIS — J301 Allergic rhinitis due to pollen: Secondary | ICD-10-CM | POA: Diagnosis not present

## 2024-05-17 DIAGNOSIS — J3081 Allergic rhinitis due to animal (cat) (dog) hair and dander: Secondary | ICD-10-CM | POA: Diagnosis not present

## 2024-05-22 DIAGNOSIS — Z23 Encounter for immunization: Secondary | ICD-10-CM | POA: Diagnosis not present

## 2024-05-22 DIAGNOSIS — E039 Hypothyroidism, unspecified: Secondary | ICD-10-CM | POA: Diagnosis not present

## 2024-05-22 DIAGNOSIS — R2689 Other abnormalities of gait and mobility: Secondary | ICD-10-CM | POA: Diagnosis not present

## 2024-05-22 DIAGNOSIS — N1831 Chronic kidney disease, stage 3a: Secondary | ICD-10-CM | POA: Diagnosis not present

## 2024-07-11 ENCOUNTER — Other Ambulatory Visit: Payer: Self-pay | Admitting: Obstetrics and Gynecology

## 2024-07-11 DIAGNOSIS — R928 Other abnormal and inconclusive findings on diagnostic imaging of breast: Secondary | ICD-10-CM

## 2024-07-27 ENCOUNTER — Other Ambulatory Visit: Payer: Self-pay | Admitting: Obstetrics and Gynecology

## 2024-07-27 ENCOUNTER — Ambulatory Visit
Admission: RE | Admit: 2024-07-27 | Discharge: 2024-07-27 | Disposition: A | Discharge status: Home / Self Care | Source: Ambulatory Visit | Attending: Obstetrics and Gynecology | Admitting: Obstetrics and Gynecology

## 2024-07-27 DIAGNOSIS — R928 Other abnormal and inconclusive findings on diagnostic imaging of breast: Secondary | ICD-10-CM

## 2025-01-28 ENCOUNTER — Encounter

## 2025-01-28 ENCOUNTER — Other Ambulatory Visit
